# Patient Record
Sex: Male | Born: 1975 | Race: Black or African American | Hispanic: No | Marital: Married | Smoking: Current every day smoker
Health system: Southern US, Community
[De-identification: ages and names within clinical notes are randomized; demographics above are authoritative.]

---

## 1998-12-03 ENCOUNTER — Encounter: Payer: Self-pay | Admitting: Emergency Medicine

## 1998-12-03 ENCOUNTER — Emergency Department (HOSPITAL_COMMUNITY): Admission: EM | Admit: 1998-12-03 | Discharge: 1998-12-03 | Payer: Self-pay | Admitting: Emergency Medicine

## 1998-12-07 ENCOUNTER — Emergency Department (HOSPITAL_COMMUNITY): Admission: EM | Admit: 1998-12-07 | Discharge: 1998-12-07 | Payer: Self-pay | Admitting: Emergency Medicine

## 1999-07-02 ENCOUNTER — Encounter: Payer: Self-pay | Admitting: Emergency Medicine

## 1999-07-02 ENCOUNTER — Emergency Department (HOSPITAL_COMMUNITY): Admission: EM | Admit: 1999-07-02 | Discharge: 1999-07-02 | Payer: Self-pay | Admitting: Emergency Medicine

## 1999-10-25 ENCOUNTER — Emergency Department (HOSPITAL_COMMUNITY): Admission: EM | Admit: 1999-10-25 | Discharge: 1999-10-25 | Payer: Self-pay | Admitting: Emergency Medicine

## 1999-11-11 ENCOUNTER — Emergency Department (HOSPITAL_COMMUNITY): Admission: EM | Admit: 1999-11-11 | Discharge: 1999-11-11 | Payer: Self-pay | Admitting: Emergency Medicine

## 2000-08-16 ENCOUNTER — Emergency Department (HOSPITAL_COMMUNITY): Admission: EM | Admit: 2000-08-16 | Discharge: 2000-08-16 | Payer: Self-pay | Admitting: Internal Medicine

## 2000-11-09 ENCOUNTER — Encounter: Payer: Self-pay | Admitting: Emergency Medicine

## 2000-11-09 ENCOUNTER — Emergency Department (HOSPITAL_COMMUNITY): Admission: EM | Admit: 2000-11-09 | Discharge: 2000-11-09 | Payer: Self-pay | Admitting: Emergency Medicine

## 2001-01-26 ENCOUNTER — Encounter: Payer: Self-pay | Admitting: Emergency Medicine

## 2001-01-26 ENCOUNTER — Emergency Department (HOSPITAL_COMMUNITY): Admission: EM | Admit: 2001-01-26 | Discharge: 2001-01-26 | Payer: Self-pay | Admitting: *Deleted

## 2001-02-01 ENCOUNTER — Ambulatory Visit (HOSPITAL_COMMUNITY): Admission: RE | Admit: 2001-02-01 | Discharge: 2001-02-01 | Payer: Self-pay | Admitting: Orthopedic Surgery

## 2001-02-01 ENCOUNTER — Encounter: Payer: Self-pay | Admitting: Orthopedic Surgery

## 2001-02-10 ENCOUNTER — Emergency Department (HOSPITAL_COMMUNITY): Admission: EM | Admit: 2001-02-10 | Discharge: 2001-02-10 | Payer: Self-pay | Admitting: Emergency Medicine

## 2001-02-10 ENCOUNTER — Encounter: Payer: Self-pay | Admitting: Emergency Medicine

## 2001-02-14 ENCOUNTER — Emergency Department (HOSPITAL_COMMUNITY): Admission: EM | Admit: 2001-02-14 | Discharge: 2001-02-14 | Payer: Self-pay

## 2003-04-12 ENCOUNTER — Emergency Department (HOSPITAL_COMMUNITY): Admission: AD | Admit: 2003-04-12 | Discharge: 2003-04-12 | Payer: Self-pay | Admitting: Family Medicine

## 2003-04-19 ENCOUNTER — Emergency Department (HOSPITAL_COMMUNITY): Admission: AD | Admit: 2003-04-19 | Discharge: 2003-04-19 | Payer: Self-pay | Admitting: Family Medicine

## 2003-05-20 ENCOUNTER — Emergency Department (HOSPITAL_COMMUNITY): Admission: EM | Admit: 2003-05-20 | Discharge: 2003-05-20 | Payer: Self-pay | Admitting: Family Medicine

## 2003-05-23 ENCOUNTER — Emergency Department (HOSPITAL_COMMUNITY): Admission: EM | Admit: 2003-05-23 | Discharge: 2003-05-23 | Payer: Self-pay | Admitting: Emergency Medicine

## 2004-03-09 ENCOUNTER — Observation Stay (HOSPITAL_COMMUNITY): Admission: EM | Admit: 2004-03-09 | Discharge: 2004-03-09 | Payer: Self-pay | Admitting: *Deleted

## 2004-03-09 ENCOUNTER — Encounter: Payer: Self-pay | Admitting: Cardiology

## 2005-01-22 ENCOUNTER — Emergency Department (HOSPITAL_COMMUNITY): Admission: EM | Admit: 2005-01-22 | Discharge: 2005-01-22 | Payer: Self-pay | Admitting: Emergency Medicine

## 2005-03-08 ENCOUNTER — Emergency Department (HOSPITAL_COMMUNITY): Admission: EM | Admit: 2005-03-08 | Discharge: 2005-03-08 | Payer: Self-pay | Admitting: Emergency Medicine

## 2005-05-17 ENCOUNTER — Emergency Department (HOSPITAL_COMMUNITY): Admission: EM | Admit: 2005-05-17 | Discharge: 2005-05-17 | Payer: Self-pay | Admitting: Emergency Medicine

## 2005-07-30 ENCOUNTER — Emergency Department (HOSPITAL_COMMUNITY): Admission: EM | Admit: 2005-07-30 | Discharge: 2005-07-30 | Payer: Self-pay | Admitting: Emergency Medicine

## 2007-12-31 ENCOUNTER — Emergency Department (HOSPITAL_COMMUNITY): Admission: EM | Admit: 2007-12-31 | Discharge: 2007-12-31 | Payer: Self-pay | Admitting: Emergency Medicine

## 2008-10-09 ENCOUNTER — Emergency Department (HOSPITAL_COMMUNITY): Admission: EM | Admit: 2008-10-09 | Discharge: 2008-10-09 | Payer: Self-pay | Admitting: Emergency Medicine

## 2008-11-21 ENCOUNTER — Emergency Department (HOSPITAL_COMMUNITY): Admission: EM | Admit: 2008-11-21 | Discharge: 2008-11-21 | Payer: Self-pay | Admitting: Emergency Medicine

## 2010-04-30 LAB — HEPATITIS C ANTIBODY, REFLEX: HCV Ab: NEGATIVE

## 2010-04-30 LAB — HEPATITIS B SURFACE ANTIGEN: Hepatitis B Surface Ag: NEGATIVE

## 2010-06-11 NOTE — Consult Note (Signed)
NAMEMADDUX, VANSCYOC NO.:  [id number]   MEDICAL RECORD NO.:  [id number]          PATIENT TYPE:  INP   LOCATION:  0103                         FACILITY:  [facility]   PHYSICIAN:  Colleen Can. Deborah Chalk, M.D.DATE OF BIRTH:  Aug 31, 1975   DATE OF CONSULTATION:  03/09/2004  DATE OF DISCHARGE:                                   CONSULTATION   Thank you very much for asking [place] to see Mr. Cormier.  He is a very pleasant,  35 year old male, who presents with chest pain which started about 11 p.m.  last night.  He was at work, has been working the evening shift and over the  last 2-3 weeks, has been drinking excessive amounts of coffee to stay awake.  He has been under a lot of stress with his wife having cervical cancer, and  he has five small children.  He lives with his mother, and his wife lives  with her mother, and he is trying to help out with both family situations.  The chest pain was somewhat sharp, associated with shortness of breath,  palpitations, and his heart racing.  The pain perhaps was more on the right  side of his chest.  He had more of his palpitations, and he got frightened  and sought medical evaluation.   PAST MEDICAL HISTORY:  A fractured right hand.   ALLERGIES:  None.   MEDICATIONS:  None.   FAMILY HISTORY:  Both parents are alive at 58, with mother with  hypertension.   SOCIAL HISTORY:  He is married.  He is employed in temporary jobs.  He  smokes 2-3 cigarettes a week, smokes marijuana 2-3 times a week, no alcohol,  denies cocaine.  He has five children in total.   REVIEW OF SYSTEMS:  He has had palpitations over the last 2 months.  He has  had increasing caffeine over the past several weeks.   PHYSICAL EXAMINATION:  GENERAL:  He is a well-developed black male in no  acute distress.  He is normal body weight and muscular.  VITAL SIGNS:  Blood pressure 142/80, heart rate 60, respiratory rate 20.  There is a sinus arrhythmia present.  SKIN:  Warm  and dry.  Color is normal.  LUNGS:  Clear.  HEART:  Regular rate and rhythm without click, murmur, or gallop.  ABDOMEN:  Soft, nontender.  EXTREMITIES:  Without edema.  NEUROLOGIC:  He is intact.   PERTINENT LABORATORY DATA:  TSH is normal.  CK and troponin are all  negative.  Chemistries are negative. __________ 43.   Urine is positive for marijuana.   Chest x-ray is normal.   EKG shows ST elevation diffusely, compatible with early repolarization.   VQ scan is negative.   2 D echocardiogram is normal.  Pericardium is normal.  Valvular function is  normal.  Left ventricular function is normal.   OVERALL IMPRESSION:  1.  Palpitations and chest pain, possibly related to stress and/or excessive      caffeine.  2.  Structurally normal heart by echocardiography.  3.  Early repolarization.  4.  Chest  pain, atypical for angina.   PLAN:  I suspect that the palpitations are related to stress, lack of sleep,  as well as caffeine.  His 2 D echocardiogram was normal and did not show  pericarditis.  I think it is okay to discharge him from the hospital.  I  cautioned him about excessives.  I have encouraged him to get some sleep and  try to avoid caffeine.  Unfortunately, his social situation is difficult but  certainly will be made better by an appropriate amount of rest.      SNT/MEDQ  D:  03/09/2004  T:  03/09/2004  Job:  [id number]   cc:   Lorelle Formosa, M.D.  [phone] E. [address]  [place]  [place] [address]  Fax: [phone]   Elliot Cousin, M.D.

## 2010-06-11 NOTE — H&P (Signed)
NAME:  Drew Crosby, Drew Crosby NO.:  [id number]   MEDICAL RECORD NO.:  [id number]          PATIENT TYPE:  EMS   LOCATION:  ED                           FACILITY:  [facility]   PHYSICIAN:  Elliot Cousin, M.D.    DATE OF BIRTH:  1975-09-16   DATE OF ADMISSION:  03/09/2004  DATE OF DISCHARGE:                                HISTORY & PHYSICAL   PRIMARY CARE PHYSICIAN:  Lorelle Formosa, M.D.   CHIEF COMPLAINT:  Chest pain and shortness of breath.   HISTORY OF PRESENT ILLNESS:  Drew Crosby is a 35 year old man with no  significant past medical history who presents to the emergency department  this morning with a chief complaint of chest pain and shortness of breath  while he was at work. At approximately 11:00 p.m. last night, while at work,  he began to have right sided chest pain and shortness of breath.  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  The pain was  describes as a pressing down feeling. There was only a little radiation to  the central chest wall.  The pain intensity was estimated at a 9-10/10.  The  pain has been intermittent all night and early into this morning. At times,  his heart feels as if it is racing. He has had accompanied palpitations. No  diaphoresis; however, he has had some transient nausea and transient light  headedness. The patient has had a mild cough primarily dry.  He has some  chest pain primarily on the right and to the central chest when coughing. He  denies specific pleurisy.  The patient has had no recent upper respiratory  infection symptoms with the exception of an intermittent dry cough.  He has  had chest pain in the past which he attributes to heavy lifting. The patient  has had no recent heavy lifting.  The patient has been under more stress  lately, his wife has been diagnosed with cervical cancer.  He smokes only 2-  3 cigarettes per week.  He denies crack cocaine use; however, he does smoke  marijuana at least  2-3 times per week.  The patient does not have a family  history of heart disease or early stroke or blood clots.   When the patient was evaluated in the emergency department, he was afebrile  and hemodynamically stable. His heart rate was borderline bradycardic  ranging between 58 and 62.  His EKG revealed ST elevations in the  anterolateral and inferior leads, possibly secondary to early  repolarization.  There was also evidence of bradycardia with a heart rate of  47 beats per minute. His chest x-ray revealed no acute disease.  His initial  cardiac markers were negative.  However, given the patient's presentation he  will be admitted for 23 hour observation.   PAST MEDICAL HISTORY:  History of right hand fracture in the past.   MEDICATIONS:  None.   ALLERGIES:  No known drug allergies.   FAMILY HISTORY:  His father is 34 years of age and has no significant past  medical  history. Specifically no history of myocardial infarction, blood  clots or cancer. His mother is 75 years of age and she is living. She has a  history of anemia and hypertension. Specifically no history of blood clots,  myocardial infarction or cancer.   SOCIAL HISTORY:  The patient is married and lives [place] Between he and his wife, there are five  children. His wife was recently diagnosed with cervical cancer.  He is  employed at [employer].  He smokes 2-3 cigarettes per week. He smokes marijuana 2-  3 times per week. He denies cocaine use and he denies any other illicit drug  use other than marijuana. He denies alcohol use.   REVIEW OF SYMPTOMS:  The patient's review of systems is positive for  increased stress and question anxiety regarding his wife's diagnosis of  cancer. He has had some chest pain in the distant past which was accompanied  by shortness of breath. The patient states that he feels the chest pain was  secondary to a muscle pull. Otherwise his review of  systems is negative.   PHYSICAL EXAMINATION:  VITAL SIGNS:  Temperature 97.4, blood pressure  142/80, pulse 60, respiratory rate 20, oxygen saturation 99% on room air.  GENERAL:  The patient is an average size 35 year old African-American man  who is currently lying in bed in no acute distress.  HEENT:  Head is normocephalic, atraumatic. Pupils equal round and reactive  to light. Extraocular movements intact. Conjunctivae are clear, sclerae are  white. Tympanic membranes are clear bilaterally. The mucosal is moist, no  drainage, no sinus tenderness.  Oropharynx reveals mildly dry mucous  membranes. No posterior exudates or erythema.  His teeth are in fair repair.  NECK:  Supple, no adenopathy, no thyromegaly, no bruit, no JVD.  LUNGS:  Clear to auscultation bilaterally. Breathing is nonlabored.  CHEST WALL:  The patient has mild tenderness over his substernal area as  well as the right chest wall.  No rash or erythema or edema seen or  palpated.  HEART:  S1, S2 with bradycardia.  ABDOMEN:  Positive bowel sounds, soft, nontender, nondistended, no  hepatosplenomegaly. No masses palpate.  GU/RECTAL:  Deferred.  EXTREMITIES:  Pedal pulses 2+ bilaterally. No pretibial edema, no pedal  edema. The patient has a good range of motion of all of his joints. No acute  joint abnormalities.  NEUROLOGIC/PSYCHOLOGIC:  The patient is alert and oriented x3.  His affect  is flat. He does wonder if he has had too much stress in his lief that has  brought on the chest pain. Cranial nerves II-XII are intact. Strength is 5/5  throughout, sensation is intact. Gait is within normal limits.   ADMISSION LABORATORY DATA:  Chest x-ray no acute disease.  EKG, bradycardia  with PVC's and ventricular rate of 47 beats per minute. Question ST  elevations in the anterolateral and inferior leads.  Myoglobin 80, CK-MB  1.4, troponin I less than 0.05.  Urinalysis negative. Urine drug screen positive only for THC.  Sodium  143, potassium 3.6, chloride 107, CO2 30,  glucose 81, BUN 13, creatinine 1.3, calcium 9.0.  WBC 3.8, hemoglobin 14.4,  hematocrit 43.4, platelets 178.   ASSESSMENT:  Chest pain accompanied by dyspnea.  The patient also has an  abnormal EKG. The patient's clinical presentation is worrisome for cardiac  disease.  The patient, however, does not have a strong family history of  heart disease.  He does have one  risk factor including tobacco use and  marijuana use.  There is also a concern regarding PE.  The patient does not  appear to have risk factors for PE as well. The patient's symptoms could  very well be secondary to anxiety and stress.  Also consider  gastroesophageal reflux disease, gastritis, etc.   PLAN:  1.  The patient will be admitted for 23 hour observation.  2.  Repeat EKG.  3.  Consult cardiology, Dr. Deborah Chalk has been consulted.  4.  Further assessment with 2-D echocardiogram and V/Q scan.  5.  Collect cardiac enzymes q.8 hours x3.  6.  Gentle IV fluids.  Will add an aspirin at 81 mg daily.  7.  Sublingual nitroglycerin as needed for chest pain as well as oxycodone      and Tylenol as needed.  8.  Empiric treatment with Pepcid at 20 mg daily.  Maalox p.r.n.  9.  Will start Xanax at 0.25 mg b.i.d.      DF/MEDQ  D:  03/09/2004  T:  03/09/2004  Job:  [id number]   cc:   Lorelle Formosa, M.D.  [phone] E. [address]  [place]  [place] [address]  Fax: [phone]

## 2010-06-11 NOTE — Op Note (Signed)
[facility]  Patient:    HARRIE, CAZAREZ Visit Number: [id number] MRN: [id number]          Service Type: DSU Location: DAY Attending Physician:  Dominica Severin Dictated by:   Elisha Ponder, M.D. Proc. Date: 02/01/01 Admit Date:  02/01/2001   CC:         Elisha Ponder, M.D.   Operative Report  DATE OF BIRTH:  03-Apr-1975.  PREOPERATIVE DIAGNOSIS:  Displaced right third metacarpal fracture, with a prior fracture that healed in dorsal fixed angulation, noted.  POSTOPERATIVE DIAGNOSIS:  Displaced right third metacarpal fracture, with a prior fracture that healed in dorsal fixed angulation, noted.  PROCEDURE: 1. Open reduction internal fixation, right third metacarpal fracture. 2. Stress radiography.  SURGEON:  Dominica Severin, M.D.  ASSISTANT:  Dorie Rank, P.A.  COMPLICATIONS:  None.  ANESTHESIA:  General.  TOURNIQUET TIME:  Less than an hour.  ESTIMATED BLOOD LOSS:  Minimal.  DRAINS:  None.  INDICATIONS FOR PROCEDURE:  This patient is a 35 year old black male who presents with the above-mentioned diagnosis. The patient was counseled in regards to the risk and benefits of surgery, including risks of infection, bleeding, anesthesia, damage to normal structures, and failure of surgery to accomplish its intended goals of relieving symptoms and restoring function. With this in mind, he desires to proceed. All questions have been encouraged and answered preoperatively. This patient does have a prior deformity about the metacarpal from a prior fracture with subsequent healing. He has fractured through the previous fracture that has healed. I have discussed with him all issues. All questions have been encouraged and answered.  DESCRIPTION OF PROCEDURE:  The patient was seen by myself and anesthesia. He was taken to the operative suite. He underwent a smooth induction of general endotracheal anesthesia, given preoperative Ancef, and laid  supine appropriately padded and prepped and draped in the usual sterile fashion. Anesthesia was done under the direction of Dr. Lyda Perone Tarasidis. Once this was done, the patient then underwent placement of a sterile towel and drape. Once _______ was isolated, the upper extremity was elevated, the tourniquet was insufflated to 250 mmHg and an incision was made transversely at the base of the third metacarpal. Dissection was carried down to the third metacarpal base and curved awl was used to enter and provide a pilot hole for an intermedullary guidewire. It was quite evident that the prior fracture deformity was going to cause problems with passing the wire intermedullary; thus, I opened the fracture site and with an awl, enlarged the medullary canal. This was a reamed technique. I reamed the fracture site proximally and distally taking care to preserve periosteal and soft tissue attachments. The patient tolerated this quite nicely. Following this, the patient had intermedullary wire, 0.062 girth approximately placed antegrade into the proximal portion of the metacarpal. This engaged the fracture site nicely, actually corrected some of the previous deformity. With this performed, the patient then had reamings placed around the fracture site. The pin was bent and seated so it would be away from the _______ compartment and the periosteal tissues were then closed over the pin so that it would not provide any friction or attritional wear against the tendons. Tourniquet was deflated, copious irrigation was applied, and the wound was then closed with 4-0 Prolene. The patient had soft compartments, normal refill, and there were no complications. He was placed in a sterile dressing and a volar pressure splint with hand in functional position. He was transferred to  the recovery room in stable condition. All sponge, needle, and instrument counts were reported as correct. He will be discharged home on  [facility], Robaxin, and Keflex x 24 hours. He will given an additional gram of Ancef in the recovery room; elevation and return to the office to see me in ten days. All questions have been encouraged and answered. Dictated by:   Elisha Ponder, M.D. Attending Physician:  Dominica Severin DD:  02/01/01 TD:  02/01/01 Job: [id number] CNO/[id number]

## 2010-06-11 NOTE — Discharge Summary (Signed)
NAMEVERNOR, MONNIG NO.:  [id number]   MEDICAL RECORD NO.:  [id number]          PATIENT TYPE:  INP   LOCATION:  0363                         FACILITY:  [facility]   PHYSICIAN:  Isidor Holts, M.D.  DATE OF BIRTH:  Oct 03, 1975   DATE OF ADMISSION:  03/09/2004  DATE OF DISCHARGE:  03/09/2004                                 DISCHARGE SUMMARY   PRIMARY CARE PHYSICIAN:  Lorelle Formosa, M.D.   DISCHARGE DIAGNOSES:  1.  Atypical chest pain/right-sided musculoskeletal chest pain.  2.  Anxiety.   DISCHARGE MEDICATIONS:  Motrin 800 mg p.o. t.i.d. with food for five days  only.   PROCEDURES:  1.  Chest x-ray dated March 09, 2004.  This showed no acute      cardiopulmonary findings.  2.  Ventilation perfusion lung scan, dated March 09, 2004.  This showed      normal ventilation perfusion pulmonary imaging.  3.  2D echocardiogram March 09, 2004. This showed overall normal left      ventricular function, with LVEF 55%-65%, and no valvular defects or      regional wall motion abnormalities   CONSULTATIONS:  Dr. Deborah Chalk, cardiology.   HISTORY OF PRESENT ILLNESS:  As in H&P notes of March 09, 2004.  However,  in brief, this is a 35 year old man with no significant past medical history  who presents with an overnight history of right-sided chest pain and  shortness of breath without any obvious precipitant.  He did have occasional  palpitations but no diaphoresis.  He denies respiratory tract symptoms,  smokes marijuana about 2-3 times a week, and smokes about 2-3 cigarettes per  week.  He was admitted for investigation, evaluation, and management.   CLINICAL COURSE:  1.  Atypical/Right-sided chest pain without obvious precipitant.  On      physical examination the patient revealed no clinical features of heart      failure.  He was hemodynamically stable.  A 12 lead EKG showed ST      elevations in anterolateral and inferior leads consistent with early     repolarization.  Chest x-ray was unremarkable.  He did have localized      chest wall soreness on the right lateral aspect.  Ventilation perfusion      lung scan dated March 09, 2004 revealed no evidence of pulmonary      embolism.  A 2-D echocardiogram dated March 09, 2004 showed normal      overall left ventricular systolic function with LV EF of 55-65% with no      regional wall motion abnormalities or valvular abnormalities.      Cardiology consultation was kindly provided by Dr. Deborah Chalk and      conclusion was that this was non-cardiac chest pain. The patient has      been commenced on Motrin 800 mg p.o. t.i.d. which he is to take for five      days.  It is likely that his chest pain is musculoskeletal in nature.   1.  Anxiety.  This is probably responsible for the patient's palpitations.  Certainly he showed no arrhythmias during the course of his hospital      stay.  Thyroid profile was within normal limits.  It is expected that      this will be followed up by his primary M.D.   1.  Substance abuse, i.e., marijuana use.  The patient has been cautioned to      refrain.   DISPOSITION:  The patient was discharged in satisfactory condition on  March 09, 2004.   PAIN MANAGEMENT:  As in discharge medications.   ACTIVITY:  As tolerated.   DIET:  No restrictions.   WOUND CARE:  Not applicable.   FOLLOW UP:  The patient is to follow up with his primary M.D., Dr. Billee Cashing, within one week.  He is to call to schedule an appointment and has  verbalized understanding.      CO/MEDQ  D:  03/09/2004  T:  03/09/2004  Job:  [id number]   cc:   Lorelle Formosa, M.D.  [phone] [address]  [place]  [place] [address]  Fax: [phone]   Colleen Can. Deborah Chalk, M.D.  Fax: [phone]

## 2011-03-14 ENCOUNTER — Emergency Department (INDEPENDENT_AMBULATORY_CARE_PROVIDER_SITE_OTHER)
Admission: EM | Admit: 2011-03-14 | Discharge: 2011-03-14 | Disposition: A | Discharge status: Home Health | Payer: Self-pay | Source: Home / Self Care | Attending: Family Medicine | Admitting: Family Medicine

## 2011-03-14 ENCOUNTER — Encounter (HOSPITAL_COMMUNITY): Payer: Self-pay

## 2011-03-14 DIAGNOSIS — K0889 Other specified disorders of teeth and supporting structures: Secondary | ICD-10-CM

## 2011-03-14 DIAGNOSIS — K089 Disorder of teeth and supporting structures, unspecified: Secondary | ICD-10-CM

## 2011-03-14 DIAGNOSIS — K007 Teething syndrome: Secondary | ICD-10-CM

## 2011-03-14 MED ORDER — AMOXICILLIN 500 MG PO CAPS: 500.0000 mg | ORAL_CAPSULE | Freq: Three times a day (TID) | ORAL | Status: AC

## 2011-03-14 MED ORDER — HYDROCODONE-ACETAMINOPHEN 5-325 MG PO TABS: ORAL_TABLET | ORAL | Status: AC

## 2011-03-14 MED ORDER — LIDOCAINE VISCOUS 2 % MT SOLN: 20.0000 mL | OROMUCOSAL | Status: AC | PRN

## 2011-03-14 NOTE — ED Provider Notes (Signed)
History     CSN: [id number]  Arrival date & time 03/14/11  1946   First MD Initiated Contact with Patient 03/14/11 1950      Chief Complaint  Patient presents with  . Oral Swelling    (Consider location/radiation/quality/duration/timing/severity/associated sxs/prior treatment) HPI Comments: Drew Crosby presents for evaluation of left lower gum pain. He denies any injury and is concerned if he has an infection or not. He reports a chronic history of dental problems and has had several teeth removed. He denies any injury to his teeth at this time. He does note a height difference between the posterior most molar and the rest of his teeth. He denies any fever or other symptoms at all. He reports onset of symptoms over the last 3 weeks. He's been using multiple regimens without any relief.  Patient is a 36 y.o. male presenting with tooth pain. The history is provided by the patient.  Dental PainThe primary symptoms include mouth pain. Primary symptoms do not include dental injury or oral bleeding. The symptoms began more than 1 week ago. The symptoms are unchanged.  Additional symptoms include: gum swelling, gum tenderness and jaw pain. Additional symptoms do not include: dental sensitivity to temperature, purulent gums and trouble swallowing.    History reviewed. No pertinent past medical history.  History reviewed. No pertinent past surgical history.  History reviewed. No pertinent family history.  History  Substance Use Topics  . Smoking status: Not on file  . Smokeless tobacco: Not on file  . Alcohol Use: Not on file      Review of Systems  Constitutional: Negative.   HENT: Positive for dental problem. Negative for trouble swallowing.   Eyes: Negative.   Respiratory: Negative.   Cardiovascular: Negative.   Gastrointestinal: Negative.   Genitourinary: Negative.   Musculoskeletal: Negative.   Skin: Negative.   Neurological: Negative.     Allergies  Review of patient's  allergies indicates no known allergies.  Home Medications   Current Outpatient Rx  Name Route Sig Dispense Refill  . AMOXICILLIN 500 MG PO CAPS Oral Take 1 capsule (500 mg total) by mouth 3 (three) times daily. 30 capsule 0  . HYDROCODONE-ACETAMINOPHEN 5-325 MG PO TABS  Take one to two tablets every 4 to 6 hours as needed for pain 10 tablet 0  . LIDOCAINE VISCOUS 2 % MT SOLN Oral Take 20 mLs by mouth as needed for pain. Gargle with 15 to 20 ml every 3 to 4 hours as needed for pain; do not swallow 100 mL 0    BP 120/67  Pulse 59  Temp(Src) 97.9 F (36.6 C) (Oral)  Resp 20  SpO2 100%  Physical Exam  Nursing note and vitals reviewed. Constitutional: He is oriented to person, place, and time. He appears well-developed and well-nourished.  HENT:  Head: Normocephalic and atraumatic.  Mouth/Throat: Uvula is midline, oropharynx is clear and moist and mucous membranes are normal. Normal dentition. Dental caries present. No dental abscesses.    Eyes: EOM are normal.  Neck: Normal range of motion.  Pulmonary/Chest: Effort normal.  Musculoskeletal: Normal range of motion.  Neurological: He is alert and oriented to person, place, and time.  Skin: Skin is warm and dry.  Psychiatric: His behavior is normal.    ED Course  Procedures (including critical care time)  Labs Reviewed - No data to display No results found.   1. Pain, dental   2. Tooth eruption       MDM  rx given for  viscous lidocaine, hydrocodone PRN, contact info for [facility]        Richardo Priest, MD 03/14/11 2149

## 2011-03-14 NOTE — Discharge Instructions (Signed)
There is no evidence of infection in your gum or teeth. It appears as if a molar is erupting. This could be a wisdom tooth, but I am not sure. The pain is from the gum inflammation. Use the viscous gel as directed. You should gargle and spit this solution and do not swallow. Use the pain pills only as needed. Do not fill the antibiotic prescription yet. Once you contact the dental provider, ask if you should take the antibiotics. Please followup with the dental provider provided.

## 2011-03-14 NOTE — ED Notes (Signed)
Reports he has been having problems w tooth swelling and painful; per patient, he has been using tylenol, nyquil, advil, ibuprofin, oragel, salt water rinses w/o relief

## 2011-06-13 ENCOUNTER — Encounter (HOSPITAL_COMMUNITY): Payer: Self-pay | Admitting: *Deleted

## 2011-06-13 ENCOUNTER — Emergency Department (HOSPITAL_COMMUNITY)
Admission: EM | Admit: 2011-06-13 | Discharge: 2011-06-13 | Disposition: A | Discharge status: Home / Self Care | Payer: Self-pay | Attending: Emergency Medicine | Admitting: Emergency Medicine

## 2011-06-13 DIAGNOSIS — L0390 Cellulitis, unspecified: Secondary | ICD-10-CM

## 2011-06-13 DIAGNOSIS — L02419 Cutaneous abscess of limb, unspecified: Secondary | ICD-10-CM | POA: Insufficient documentation

## 2011-06-13 DIAGNOSIS — L0291 Cutaneous abscess, unspecified: Secondary | ICD-10-CM

## 2011-06-13 MED ORDER — SULFAMETHOXAZOLE-TMP DS 800-160 MG PO TABS
1.0000 | ORAL_TABLET | Freq: Once | ORAL | Status: AC
  Administered 2011-06-13: 1 via ORAL
  Filled 2011-06-13: qty 1

## 2011-06-13 MED ORDER — OXYCODONE-ACETAMINOPHEN 5-325 MG PO TABS
1.0000 | ORAL_TABLET | Freq: Once | ORAL | Status: AC
  Administered 2011-06-13: 1 via ORAL
  Filled 2011-06-13: qty 1

## 2011-06-13 MED ORDER — OXYCODONE-ACETAMINOPHEN 5-325 MG PO TABS: 1.0000 | ORAL_TABLET | ORAL | Status: AC | PRN

## 2011-06-13 MED ORDER — SULFAMETHOXAZOLE-TRIMETHOPRIM 800-160 MG PO TABS: 1.0000 | ORAL_TABLET | Freq: Two times a day (BID) | ORAL | Status: AC

## 2011-06-13 NOTE — ED Provider Notes (Signed)
Medical screening examination/treatment/procedure(s) were performed by non-physician practitioner and as supervising physician I was immediately available for consultation/collaboration.   Anijah Spohr, MD 06/13/11 1617 

## 2011-06-13 NOTE — ED Provider Notes (Signed)
History     CSN: [id number]  Arrival date & time 06/13/11  1131   First MD Initiated Contact with Patient 06/13/11 1238      Chief Complaint  Patient presents with  . Abscess    (Consider location/radiation/quality/duration/timing/severity/associated sxs/prior treatment) Patient is a 36 y.o. male presenting with abscess. The history is provided by the patient.  Abscess  This is a new problem. The current episode started less than one week ago. The problem occurs continuously. The problem has been gradually worsening. The abscess is present on the left ankle. The abscess is characterized by painfulness, draining, swelling and redness. Pertinent negatives include no fever. Associated symptoms comments: He thought initially he had been bitten by an insect but presents for evaluation after redness and swelling became worse, also having mild drainage from central area..    History reviewed. No pertinent past medical history.  History reviewed. No pertinent past surgical history.  History reviewed. No pertinent family history.  History  Substance Use Topics  . Smoking status: Current Everyday Smoker    Types: Cigarettes  . Smokeless tobacco: Not on file  . Alcohol Use: No      Review of Systems  Constitutional: Negative for fever.  Musculoskeletal: Positive for joint swelling.       See HPI.  Skin: Positive for wound.    Allergies  Review of patient's allergies indicates no known allergies.  Home Medications   Current Outpatient Rx  Name Route Sig Dispense Refill  . IBUPROFEN 800 MG PO TABS Oral Take 800 mg by mouth every 8 (eight) hours as needed. For pain      BP 117/73  Pulse 69  Temp 98.3 F (36.8 C)  Resp 16  SpO2 99%  Physical Exam  Constitutional: He appears well-developed and well-nourished.  Musculoskeletal: Normal range of motion.  Skin:       Left LE has an indurated area to posterolateral surface with small ulceration in the center that is not  currently draining. Swelling extends without induration to ankle. Redness from distal LE to lateral aspect of foot. Pulses 2+. ROM of ankle joint without pain. No calf tenderness.    ED Course  Procedures (including critical care time) INCISION AND DRAINAGE Performed by: Langley Adie A Consent: Verbal consent obtained. Risks and benefits: risks, benefits and alternatives were discussed Type: abscess  Body area: left lower leg  Anesthesia: local infiltration  Local anesthetic: lidocaine 1% w/o epinephrine  Anesthetic total: 1 ml  Complexity: complex Blunt dissection to break up loculations  Drainage: purulent  Drainage amount: small  Packing material: 1/4 in iodoform gauze  Patient tolerance: Patient tolerated the procedure well with no immediate complications.    Labs Reviewed - No data to display No results found.   No diagnosis found. 1. Cutaneous abscess 2. cellulitis  MDM   Patient feels improved after I&D. Pain controlled. Abx started, instructions given for 2 day recheck.        Rodena Medin, PA-C 06/13/11 1433

## 2011-06-13 NOTE — Discharge Instructions (Signed)
RETURN IN 2 DAYS FOR RECHECK OF ABSCESS AND TO HAVE PACKING REMOVED. TAKE MEDICATIONS AS PRESCRIBED. RETURN SOONER THAN 2 DAYS IF YOU HAVE A HIGH FEVER, SEVERE PAIN, OR IF THE REDNESS EXTENDS SIGNIFICANTLY BEYOND THE MARKED AREA IN THE LOWER LEG.   Cellulitis Cellulitis is an infection of the tissue under the skin. The infected area is usually red and tender. This is caused by germs. These germs enter the body through cuts or sores. This usually happens in the arms or lower legs. HOME CARE   Take your medicine as told. Finish it even if you start to feel better.   If the infection is on the arm or leg, keep it raised (elevated).   Use a warm cloth on the infected area several times a day.   See your doctor for a follow-up visit as told.  GET HELP RIGHT AWAY IF:   You are tired or confused.   You throw up (vomit).   You have watery poop (diarrhea).   You feel ill and have muscle aches.   You have a fever.  MAKE SURE YOU:   Understand these instructions.   Will watch your condition.   Will get help right away if you are not doing well or get worse.  Document Released: 06/29/2007 Document Revised: 12/30/2010 Document Reviewed: 12/12/2008 [facility] Patient Information 2012 [place], [place].  Abscess An abscess (boil or furuncle) is an infected area that contains a collection of pus.  SYMPTOMS Signs and symptoms of an abscess include pain, tenderness, redness, or hardness. You may feel a moveable soft area under your skin. An abscess can occur anywhere in the body.  TREATMENT  A surgical cut (incision) may be made over your abscess to drain the pus. Gauze may be packed into the space or a drain may be looped through the abscess cavity (pocket). This provides a drain that will allow the cavity to heal from the inside outwards. The abscess may be painful for a few days, but should feel much better if it was drained.  Your abscess, if seen early, may not have localized and may not have  been drained. If not, another appointment may be required if it does not get better on its own or with medications. HOME CARE INSTRUCTIONS   Only take over-the-counter or prescription medicines for pain, discomfort, or fever as directed by your caregiver.   Take your antibiotics as directed if they were prescribed. Finish them even if you start to feel better.   Keep the skin and clothes clean around your abscess.   If the abscess was drained, you will need to use gauze dressing to collect any draining pus. Dressings will typically need to be changed 3 or more times a day.   The infection may spread by skin contact with others. Avoid skin contact as much as possible.   Practice good hygiene. This includes regular hand washing, cover any draining skin lesions, and do not share personal care items.   If you participate in sports, do not share athletic equipment, towels, whirlpools, or personal care items. Shower after every practice or tournament.   If a draining area cannot be adequately covered:   Do not participate in sports.   Children should not participate in day care until the wound has healed or drainage stops.   If your caregiver has given you a follow-up appointment, it is very important to keep that appointment. Not keeping the appointment could result in a much worse infection, chronic  or permanent injury, pain, and disability. If there is any problem keeping the appointment, you must call back to this facility for assistance.  SEEK MEDICAL CARE IF:   You develop increased pain, swelling, redness, drainage, or bleeding in the wound site.   You develop signs of generalized infection including muscle aches, chills, fever, or a general ill feeling.   You have an oral temperature above 102 F (38.9 C).  MAKE SURE YOU:   Understand these instructions.   Will watch your condition.   Will get help right away if you are not doing well or get worse.  Document Released:  10/20/2004 Document Revised: 12/30/2010 Document Reviewed: 08/14/2007 [facility] Patient Information 2012 [place], [place].

## 2011-06-13 NOTE — ED Notes (Signed)
To ED for eval of possible insect bite to left ankle area. Pt states he noticed a white head on area last night. Now left ankle down to foot is swollen and painful.

## 2011-06-16 LAB — CULTURE, ROUTINE-ABSCESS

## 2011-06-17 NOTE — ED Notes (Signed)
+  Abscess. Patient treated with Bactrim DS. Sensitive to same. Per protocol MD.

## 2013-12-08 ENCOUNTER — Encounter (HOSPITAL_COMMUNITY): Payer: Self-pay | Admitting: Emergency Medicine

## 2013-12-08 ENCOUNTER — Emergency Department (HOSPITAL_COMMUNITY)
Admission: EM | Admit: 2013-12-08 | Discharge: 2013-12-08 | Disposition: A | Discharge status: Home / Self Care | Payer: Self-pay | Attending: Emergency Medicine | Admitting: Emergency Medicine

## 2013-12-08 DIAGNOSIS — Z720 Tobacco use: Secondary | ICD-10-CM | POA: Insufficient documentation

## 2013-12-08 DIAGNOSIS — K047 Periapical abscess without sinus: Secondary | ICD-10-CM | POA: Insufficient documentation

## 2013-12-08 DIAGNOSIS — K029 Dental caries, unspecified: Secondary | ICD-10-CM | POA: Insufficient documentation

## 2013-12-08 MED ORDER — AMOXICILLIN 500 MG PO CAPS: 500.0000 mg | ORAL_CAPSULE | Freq: Three times a day (TID) | ORAL | Status: AC

## 2013-12-08 MED ORDER — OXYCODONE-ACETAMINOPHEN 5-325 MG PO TABS: ORAL_TABLET | ORAL | Status: DC

## 2013-12-08 MED ORDER — AMOXICILLIN 500 MG PO CAPS
500.0000 mg | ORAL_CAPSULE | Freq: Once | ORAL | Status: AC
  Administered 2013-12-08: 500 mg via ORAL
  Filled 2013-12-08: qty 1

## 2013-12-08 MED ORDER — OXYCODONE-ACETAMINOPHEN 5-325 MG PO TABS
1.0000 | ORAL_TABLET | Freq: Once | ORAL | Status: AC
  Administered 2013-12-08: 1 via ORAL
  Filled 2013-12-08: qty 1

## 2013-12-08 NOTE — ED Notes (Signed)
Pt presents with left lower dental pain onset yesterday- admits to taking Motrin at home without relief.

## 2013-12-08 NOTE — Discharge Instructions (Signed)
Take percocet for breakthrough pain, do not drink alcohol, drive, care for children or do other critical tasks while taking percocet.  Return to the emergency room for fever, change in vision, redness to the face that rapidly spreads towards the eye, nausea or vomiting, difficulty swallowing or shortness of breath.   Apply warm compresses to jaw throughout the day.    Take your antibiotics as directed and to the end of the course.   Followup with a dentist is very important for ongoing evaluation and management of recurrent dental pain. Return to emergency department for emergent changing or worsening symptoms."  Low-cost dental clinic: Drew Crosby**Drew Crosby  at [phone]**  **Drew Crosby at [phone] [address]**    You may also call [phone]  Dental Assistance If the dentist on-call cannot see you, please use the resources below:   Patients with Medicaid: [facility] [phone], [phone] [address], [phone]  If unable to pay, or uninsured, contact [facility] [phone]) or [facility] [phone] in [place], [phone] in [facility]) to become qualified for the adult dental clinic  Other Low-Cost Community Dental Services: [facility]- [address], [place], [place], [address]    [phone], Ext. [phone]    2nd and 4th Thursday of the month at 6:30am    10 clients each day by appointment, can sometimes see walk-in     patients if someone does not show for an appointment [facility]- [address], [place], [place], [address]    [phone] [facility]- [address], [place], [place], [address]    [phone]  [facility]- [phone] [facility]- [phone] [phone]- [phone]   Abscess An abscess (boil or furuncle) is an infected area on or under the skin. This area is filled with yellowish-white  fluid (pus) and other material (debris). HOME CARE   Only take medicines as told by your doctor.  If you were given antibiotic medicine, take it as directed. Finish the medicine even if you start to feel better.  If gauze is used, follow your doctor's directions for changing the gauze.  To avoid spreading the infection:  Keep your abscess covered with a bandage.  Wash your hands well.  Do not share personal care items, towels, or whirlpools with others.  Avoid skin contact with others.  Keep your skin and clothes clean around the abscess.  Keep all doctor visits as told. GET HELP RIGHT AWAY IF:   You have more pain, puffiness (swelling), or redness in the wound site.  You have more fluid or blood coming from the wound site.  You have muscle aches, chills, or you feel sick.  You have a fever. MAKE SURE YOU:   Understand these instructions.  Will watch your condition.  Will get help right away if you are not doing well or get worse. Document Released: 06/29/2007 Document Revised: 07/12/2011 Document Reviewed: 03/25/2011 [facility] Patient Information 2015 [place]. This information is not intended to replace advice given to you by your health care provider. Make sure you discuss any questions you have with your health care provider.  Dental Abscess A dental abscess is a collection of infected fluid (pus) from a bacterial infection in the inner part of the tooth (pulp). It usually occurs at the end of the tooth's root.  CAUSES   Severe tooth decay. Trau Dental Abscess A dental abscess is a collection  of infected fluid (pus) from a bacterial infection in the inner part of the tooth (pulp). It usually occurs at the end of the tooth's root.  CAUSES   Severe tooth decay.  Trauma to the tooth that allows bacteria to enter into the pulp, such as a broken or chipped tooth. SYMPTOMS   Severe pain in and around the infected tooth.  Swelling and redness around the  abscessed tooth or in the mouth or face.  Tenderness.  Pus drainage.  Bad breath.  Bitter taste in the mouth.  Difficulty swallowing.  Difficulty opening the mouth.  Nausea.  Vomiting.  Chills.  Swollen neck glands. DIAGNOSIS   A medical and dental history will be taken.  An examination will be performed by tapping on the abscessed tooth.  X-rays may be taken of the tooth to identify the abscess. TREATMENT The goal of treatment is to eliminate the infection. You may be prescribed antibiotic medicine to stop the infection from spreading. A root canal may be performed to save the tooth. If the tooth cannot be saved, it may be pulled (extracted) and the abscess may be drained.  HOME CARE INSTRUCTIONS  Only take over-the-counter or prescription medicines for pain, fever, or discomfort as directed by your caregiver.  Rinse your mouth (gargle) often with salt water ( tsp salt in 8 oz [250 ml] of warm water) to relieve pain or swelling.  Do not drive after taking pain medicine (narcotics).  Do not apply heat to the outside of your face.  Return to your dentist for further treatment as directed. SEEK MEDICAL CARE IF:  Your pain is not helped by medicine.  Your pain is getting worse instead of better. SEEK IMMEDIATE MEDICAL CARE IF:  You have a fever or persistent symptoms for more than 2-3 days.  You have a fever and your symptoms suddenly get worse.  You have chills or a very bad headache.  You have problems breathing or swallowing.  You have trouble opening your mouth.  You have swelling in the neck or around the eye. Document Released: 01/10/2005 Document Revised: 10/05/2011 Document Reviewed: 04/20/2010 [facility] Patient Information 2015 [place], [place]. This information is not intended to replace advice given to you by your health care provider. Make sure you discuss any questions you have with your health care provider.

## 2013-12-08 NOTE — ED Provider Notes (Signed)
CSN: [id number]     Arrival date & time 12/08/13  1511 History  This chart was scribed for non-physician practitioner, Wynetta EmeryNicole Dasan Hardman, PA-C,working with Lyanne CoKevin M Campos, MD, by Karle PlumberJennifer Tensley, ED Scribe. This patient was seen in room TR10C/TR10C and the patient's care was started at 4:35 PM.  Chief Complaint  Patient presents with  . Dental Pain   Patient is a 38 y.o. male presenting with tooth pain. The history is provided by the patient. No language interpreter was used.  Dental Pain Associated symptoms: facial swelling   Associated symptoms: no fever     HPI Comments:  Drew Crosby is a 38 y.o. male who presents to the Emergency Department complaining of severe left lower dental pain that began three days ago. He reports associated facial swelling and chills. Pt reports swallowing or eating makes the pain worse. Denies any alleviating factors. He has taken Motrin with no significant relief of the pain. He denies fever, nausea, vomiting.  History reviewed. No pertinent past medical history. History reviewed. No pertinent past surgical history. No family history on file. History  Substance Use Topics  . Smoking status: Current Every Day Smoker    Types: Cigarettes  . Smokeless tobacco: Not on file  . Alcohol Use: No    Review of Systems  Constitutional: Positive for chills. Negative for fever.  HENT: Positive for dental problem and facial swelling.   Gastrointestinal: Negative for nausea and vomiting.  All other systems reviewed and are negative.   Allergies  Review of patient's allergies indicates no known allergies.  Home Medications   Prior to Admission medications   Medication Sig Start Date End Date Taking? Authorizing Provider  amoxicillin (AMOXIL) 500 MG capsule Take 1 capsule (500 mg total) by mouth 3 (three) times daily. 12/08/13   Katja Blue, PA-C  ibuprofen (ADVIL,MOTRIN) 800 MG tablet Take 800 mg by mouth every 8 (eight) hours as needed. For pain     Historical Provider, MD  oxyCODONE-acetaminophen (PERCOCET/ROXICET) 5-325 MG per tablet 1 to 2 tabs PO q6hrs  PRN for pain 12/08/13   Wynetta EmeryNicole Sanuel Ladnier, PA-C   Triage Vitals: BP 115/90 mmHg  Pulse 59  Temp(Src) 98.2 F (36.8 C) (Oral)  Resp 16  Ht 5\' 6"  (1.676 m)  Wt 170 lb 1 oz (77.14 kg)  BMI 27.46 kg/m2  SpO2 97% Physical Exam  Constitutional: He is oriented to person, place, and time. He appears well-developed and well-nourished. No distress.  HENT:  Head: Normocephalic and atraumatic.  Mouth/Throat: Oropharynx is clear and moist.  Left mandibular swelling. Patient is speaking in complete sentences, handling his secretions.  Generally poor dentition, left lower posterior teeth with extensive dental caries and soft tissue swelling, no focal fluctuance or abscess. No tenderness palpation or induration under tongue.  Eyes: Conjunctivae and EOM are normal. Pupils are equal, round, and reactive to light.  Neck: Normal range of motion.  Cardiovascular: Normal rate.   Pulmonary/Chest: Effort normal. No stridor.  Abdominal: Soft.  Musculoskeletal: Normal range of motion.  Neurological: He is alert and oriented to person, place, and time.  Skin: Skin is warm and dry.  Psychiatric: He has a normal mood and affect. His behavior is normal.  Nursing note and vitals reviewed.   ED Course  Procedures (including critical care time) DIAGNOSTIC STUDIES: Oxygen Saturation is 97% on RA, normal by my interpretation.   COORDINATION OF CARE: 4:37 PM- Will prescribe pain medication and antibiotics. Will give resource guide to follow up with dentist.  Pt verbalizes understanding and agrees to plan.  Medications  oxyCODONE-acetaminophen (PERCOCET/ROXICET) 5-325 MG per tablet 1 tablet (1 tablet Oral Given 12/08/13 1646)  amoxicillin (AMOXIL) capsule 500 mg (500 mg Oral Given 12/08/13 1646)    Labs Review Labs Reviewed - No data to display  Imaging Review No results found.   EKG  Interpretation None      MDM   Final diagnoses:  Pain due to dental caries  Dental abscess    Filed Vitals:   12/08/13 1526 12/08/13 1646  BP: 115/90 116/87  Pulse: 59 60  Temp: 98.2 F (36.8 C) 98.4 F (36.9 C)  TempSrc: Oral Oral  Resp: 16 16  Height: 5\' 6"  (1.676 m)   Weight: 170 lb 1 oz (77.14 kg)   SpO2: 97% 98%    Medications  oxyCODONE-acetaminophen (PERCOCET/ROXICET) 5-325 MG per tablet 1 tablet (1 tablet Oral Given 12/08/13 1646)  amoxicillin (AMOXIL) capsule 500 mg (500 mg Oral Given 12/08/13 1646)    Drew Crosby is a 38 y.o. male presenting with dental pain and dental caries, physical exam is consistent with dental abscess. No focal areas of fluctuance that would be optimal for  Agent drainage. Will start patient on amoxicillin and Percocet.  Evaluation does not show pathology that would require ongoing emergent intervention or inpatient treatment. Pt is hemodynamically stable and mentating appropriately. Discussed findings and plan with patient/guardian, who agrees with care plan. All questions answered. Return precautions discussed and outpatient follow up given.   Discharge Medication List as of 12/08/2013  4:40 PM    START taking these medications   Details  amoxicillin (AMOXIL) 500 MG capsule Take 1 capsule (500 mg total) by mouth 3 (three) times daily., Starting 12/08/2013, Until Discontinued, Print    oxyCODONE-acetaminophen (PERCOCET/ROXICET) 5-325 MG per tablet 1 to 2 tabs PO q6hrs  PRN for pain, Print           I personally performed the services described in this documentation, which was scribed in my presence. The recorded information has been reviewed and is accurate.    Wynetta Emeryicole Ellery Meroney, PA-C 12/08/13 2157  Lyanne CoKevin M Campos, MD 12/11/13 0400

## 2014-08-27 ENCOUNTER — Emergency Department (INDEPENDENT_AMBULATORY_CARE_PROVIDER_SITE_OTHER)
Admission: EM | Admit: 2014-08-27 | Discharge: 2014-08-27 | Disposition: A | Discharge status: Home / Self Care | Payer: Self-pay | Source: Home / Self Care | Attending: Family Medicine | Admitting: Family Medicine

## 2014-08-27 ENCOUNTER — Encounter (HOSPITAL_COMMUNITY): Payer: Self-pay | Admitting: Emergency Medicine

## 2014-08-27 DIAGNOSIS — K029 Dental caries, unspecified: Secondary | ICD-10-CM

## 2014-08-27 MED ORDER — DICLOFENAC POTASSIUM 50 MG PO TABS: 50.0000 mg | ORAL_TABLET | Freq: Three times a day (TID) | ORAL | Status: DC

## 2014-08-27 MED ORDER — CLINDAMYCIN HCL 300 MG PO CAPS: 300.0000 mg | ORAL_CAPSULE | Freq: Three times a day (TID) | ORAL | Status: AC

## 2014-08-27 NOTE — Discharge Instructions (Signed)
Take medicine as prescribed, see your dentist as soon as possible °

## 2014-08-27 NOTE — ED Notes (Signed)
C/o right top wisdom teeth pain States tooth broke while eating Ibuprofen, ice, gargling with peroxide was used as tx

## 2014-08-27 NOTE — ED Provider Notes (Signed)
CSN: [id number]     Arrival date & time 08/27/14  1510 History   First MD Initiated Contact with Patient 08/27/14 1555     Chief Complaint  Patient presents with  . Dental Pain   (Consider location/radiation/quality/duration/timing/severity/associated sxs/prior Treatment) Patient is a 39 y.o. male presenting with tooth pain. The history is provided by the patient.  Dental Pain Location:  Upper Upper teeth location:  1/RU 3rd molar Quality:  Throbbing Severity:  Moderate Onset quality:  Gradual Duration:  1 week Progression:  Worsening Chronicity:  New Context: dental caries, dental fracture and poor dentition   Relieved by:  None tried Worsened by:  Nothing tried Ineffective treatments:  None tried Associated symptoms: no difficulty swallowing, no drooling, no facial swelling and no fever   Risk factors: lack of dental care and smoking     History reviewed. No pertinent past medical history. History reviewed. No pertinent past surgical history. History reviewed. No pertinent family history. History  Substance Use Topics  . Smoking status: Current Every Day Smoker    Types: Cigarettes  . Smokeless tobacco: Not on file  . Alcohol Use: No    Review of Systems  Constitutional: Negative.  Negative for fever.  HENT: Positive for dental problem. Negative for drooling and facial swelling.     Allergies  Review of patient's allergies indicates no known allergies.  Home Medications   Prior to Admission medications   Medication Sig Start Date End Date Taking? Authorizing Provider  amoxicillin (AMOXIL) 500 MG capsule Take 1 capsule (500 mg total) by mouth 3 (three) times daily. 12/08/13   Nicole Pisciotta, PA-C  clindamycin (CLEOCIN) 300 MG capsule Take 1 capsule (300 mg total) by mouth 3 (three) times daily. 08/27/14   Linna Hoff, MD  diclofenac (CATAFLAM) 50 MG tablet Take 1 tablet (50 mg total) by mouth 3 (three) times daily. For dental pain 08/27/14   Linna Hoff, MD   ibuprofen (ADVIL,MOTRIN) 800 MG tablet Take 800 mg by mouth every 8 (eight) hours as needed. For pain    Historical Provider, MD  oxyCODONE-acetaminophen (PERCOCET/ROXICET) 5-325 MG per tablet 1 to 2 tabs PO q6hrs  PRN for pain 12/08/13   Nicole Pisciotta, PA-C   BP 128/86 mmHg  Pulse 54  Temp(Src) 98.4 F (36.9 C) (Oral)  Resp 16  SpO2 100% Physical Exam  Constitutional: He is oriented to person, place, and time. He appears well-developed and well-nourished. He appears distressed.  HENT:  Mult dental caries and poor dentition upper and lower teeth.  Neck: Normal range of motion. Neck supple.  Lymphadenopathy:    He has no cervical adenopathy.  Neurological: He is alert and oriented to person, place, and time.  Skin: Skin is warm and dry.  Nursing note and vitals reviewed.   ED Course  Procedures (including critical care time) Labs Review Labs Reviewed - No data to display  Imaging Review No results found.   MDM   1. Pain due to dental caries        Linna Hoff, MD 08/27/14 [phone]

## 2014-11-19 ENCOUNTER — Emergency Department (INDEPENDENT_AMBULATORY_CARE_PROVIDER_SITE_OTHER): Payer: Self-pay

## 2014-11-19 ENCOUNTER — Encounter (HOSPITAL_COMMUNITY): Payer: Self-pay | Admitting: *Deleted

## 2014-11-19 ENCOUNTER — Emergency Department (INDEPENDENT_AMBULATORY_CARE_PROVIDER_SITE_OTHER)
Admission: EM | Admit: 2014-11-19 | Discharge: 2014-11-19 | Disposition: A | Discharge status: Home / Self Care | Payer: Self-pay | Source: Home / Self Care | Attending: Family Medicine | Admitting: Family Medicine

## 2014-11-19 DIAGNOSIS — S76911A Strain of unspecified muscles, fascia and tendons at thigh level, right thigh, initial encounter: Secondary | ICD-10-CM

## 2014-11-19 DIAGNOSIS — W102XXA Fall (on)(from) incline, initial encounter: Secondary | ICD-10-CM

## 2014-11-19 MED ORDER — DICLOFENAC POTASSIUM 50 MG PO TABS: 50.0000 mg | ORAL_TABLET | Freq: Three times a day (TID) | ORAL | Status: DC

## 2014-11-19 NOTE — ED Provider Notes (Signed)
CSN: [id number]     Arrival date & time 11/19/14  1303 History   First MD Initiated Contact with Patient 11/19/14 1354     Chief Complaint  Patient presents with  . Fall   (Consider location/radiation/quality/duration/timing/severity/associated sxs/prior Treatment) Patient is a 39 y.o. male presenting with fall. The history is provided by the patient.  Fall This is a new problem. The current episode started more than 1 week ago (fell otj landing on right side with primary c/o knee and thigh pain continuing). The problem has not changed since onset.Pertinent negatives include no abdominal pain.    History reviewed. No pertinent past medical history. History reviewed. No pertinent past surgical history. History reviewed. No pertinent family history. Social History  Substance Use Topics  . Smoking status: Current Every Day Smoker    Types: Cigarettes  . Smokeless tobacco: None  . Alcohol Use: No    Review of Systems  Cardiovascular: Negative.   Gastrointestinal: Negative for abdominal pain.  Genitourinary: Negative for flank pain.  Musculoskeletal: Negative for back pain, joint swelling and gait problem.  Skin: Negative.   All other systems reviewed and are negative.   Allergies  Review of patient's allergies indicates no known allergies.  Home Medications   Prior to Admission medications   Medication Sig Start Date End Date Taking? Authorizing Provider  amoxicillin (AMOXIL) 500 MG capsule Take 1 capsule (500 mg total) by mouth 3 (three) times daily. 12/08/13   Nicole Pisciotta, PA-C  clindamycin (CLEOCIN) 300 MG capsule Take 1 capsule (300 mg total) by mouth 3 (three) times daily. 08/27/14   Linna HoffJames D Massimiliano Rohleder, MD  diclofenac (CATAFLAM) 50 MG tablet Take 1 tablet (50 mg total) by mouth 3 (three) times daily. For leg pain 11/19/14   Linna HoffJames D Alexzandra Bilton, MD  ibuprofen (ADVIL,MOTRIN) 800 MG tablet Take 800 mg by mouth every 8 (eight) hours as needed. For pain    Historical Provider, MD   oxyCODONE-acetaminophen (PERCOCET/ROXICET) 5-325 MG per tablet 1 to 2 tabs PO q6hrs  PRN for pain 12/08/13   Joni ReiningNicole Pisciotta, PA-C   Meds Ordered and Administered this Visit  Medications - No data to display  BP 102/70 mmHg  Pulse 68  Temp(Src) 98 F (36.7 C) (Oral)  Resp 16  SpO2 99% No data found.   Physical Exam  Constitutional: He is oriented to person, place, and time. He appears well-developed and well-nourished.  Abdominal: Soft. Bowel sounds are normal. He exhibits no distension and no mass. There is no tenderness. There is no rebound and no guarding.  Musculoskeletal: He exhibits tenderness.       Right knee: He exhibits normal range of motion, no swelling, no effusion, normal alignment and normal patellar mobility.  Neurological: He is alert and oriented to person, place, and time.  Skin: Skin is warm and dry.  Nursing note and vitals reviewed.   ED Course  Procedures (including critical care time)  Labs Review Labs Reviewed - No data to display  Imaging Review Dg Femur 1v Right  11/19/2014  CLINICAL DATA:  Status post fall with injury and pain to the distal right femur. EXAM: RIGHT FEMUR 1 VIEW COMPARISON:  None. FINDINGS: There is no evidence of fracture or other focal bone lesions. Soft tissues are unremarkable. IMPRESSION: No acute fracture or dislocation identified about the right femur. Electronically Signed   By: Ted Mcalpineobrinka  Dimitrova M.D.   On: 11/19/2014 14:33    X-rays reviewed and report per radiologist.  Visual Acuity Review  Right Eye Distance:   Left Eye Distance:   Bilateral Distance:    Right Eye Near:   Left Eye Near:    Bilateral Near:         MDM   1. Muscle strain of thigh, right, initial encounter   2. Fall (on)(from) incline, initial encounter        Linna Hoff, MD 11/20/14 1313

## 2014-11-19 NOTE — Discharge Instructions (Signed)
Use medicine as needed and see orthopedist if further problems. °

## 2014-11-19 NOTE — ED Notes (Signed)
Pt  Reports he  Felled  8  Days   Ago  And   injured  His  r  Leg  Knee        He  Reports  Pain       When  He  Bears  Weight          Pt  Ambulated   To  Room  With a  Steady  Fluid  Gait   -        Pt  Is sitting   Upright on the  Exam table  Speaking in  Complete  sentances

## 2014-12-29 ENCOUNTER — Encounter (HOSPITAL_COMMUNITY): Payer: Self-pay | Admitting: *Deleted

## 2014-12-29 ENCOUNTER — Emergency Department (HOSPITAL_COMMUNITY)
Admission: EM | Admit: 2014-12-29 | Discharge: 2014-12-29 | Discharge status: Against Medical Advice | Payer: Self-pay | Attending: Emergency Medicine | Admitting: Emergency Medicine

## 2014-12-29 DIAGNOSIS — S79912A Unspecified injury of left hip, initial encounter: Secondary | ICD-10-CM | POA: Insufficient documentation

## 2014-12-29 DIAGNOSIS — S8991XA Unspecified injury of right lower leg, initial encounter: Secondary | ICD-10-CM | POA: Insufficient documentation

## 2014-12-29 DIAGNOSIS — S79911A Unspecified injury of right hip, initial encounter: Secondary | ICD-10-CM | POA: Insufficient documentation

## 2014-12-29 DIAGNOSIS — Y990 Civilian activity done for income or pay: Secondary | ICD-10-CM | POA: Insufficient documentation

## 2014-12-29 DIAGNOSIS — M791 Myalgia, unspecified site: Secondary | ICD-10-CM

## 2014-12-29 DIAGNOSIS — Y9389 Activity, other specified: Secondary | ICD-10-CM | POA: Insufficient documentation

## 2014-12-29 DIAGNOSIS — S4991XA Unspecified injury of right shoulder and upper arm, initial encounter: Secondary | ICD-10-CM | POA: Insufficient documentation

## 2014-12-29 DIAGNOSIS — W1789XA Other fall from one level to another, initial encounter: Secondary | ICD-10-CM | POA: Insufficient documentation

## 2014-12-29 DIAGNOSIS — S4992XA Unspecified injury of left shoulder and upper arm, initial encounter: Secondary | ICD-10-CM | POA: Insufficient documentation

## 2014-12-29 DIAGNOSIS — F17210 Nicotine dependence, cigarettes, uncomplicated: Secondary | ICD-10-CM | POA: Insufficient documentation

## 2014-12-29 DIAGNOSIS — W19XXXA Unspecified fall, initial encounter: Secondary | ICD-10-CM

## 2014-12-29 DIAGNOSIS — Y9289 Other specified places as the place of occurrence of the external cause: Secondary | ICD-10-CM | POA: Insufficient documentation

## 2014-12-29 MED ORDER — CYCLOBENZAPRINE HCL 10 MG PO TABS: 10.0000 mg | ORAL_TABLET | Freq: Two times a day (BID) | ORAL | Status: DC | PRN

## 2014-12-29 MED ORDER — CYCLOBENZAPRINE HCL 10 MG PO TABS: 10.0000 mg | ORAL_TABLET | Freq: Two times a day (BID) | ORAL | Status: AC | PRN

## 2014-12-29 MED ORDER — TRAMADOL HCL 50 MG PO TABS: 50.0000 mg | ORAL_TABLET | Freq: Four times a day (QID) | ORAL | Status: DC | PRN

## 2014-12-29 MED ORDER — NAPROXEN 500 MG PO TABS: 500.0000 mg | ORAL_TABLET | Freq: Two times a day (BID) | ORAL | Status: DC

## 2014-12-29 MED ORDER — NAPROXEN 375 MG PO TABS: 375.0000 mg | ORAL_TABLET | Freq: Two times a day (BID) | ORAL | Status: AC

## 2014-12-29 NOTE — ED Notes (Signed)
Pt reports falling approx 2 weeks ago, fell from 3rd floor to 2nd floor at work. Having pain to bilateral shoulders, hips and right knee. Ambulatory at triage.

## 2014-12-29 NOTE — ED Notes (Signed)
SEE PA assessment 

## 2014-12-29 NOTE — ED Notes (Signed)
Declined W/C at D/C and was escorted to lobby by RN. 

## 2014-12-29 NOTE — Discharge Instructions (Signed)
Muscle Pain, Adult  Muscle pain (myalgia) may be caused by many things, including:  · Overuse or muscle strain, especially if you are not in shape. This is the most common cause of muscle pain.  · Injury.  · Bruises.  · Viruses, such as the flu.  · Infectious diseases.  · Fibromyalgia, which is a chronic condition that causes muscle tenderness, fatigue, and headache.  · Autoimmune diseases, including lupus.  · Certain drugs, including ACE inhibitors and statins.  Muscle pain may be mild or severe. In most cases, the pain lasts only a short time and goes away without treatment. To diagnose the cause of your muscle pain, your health care provider will take your medical history. This means he or she will ask you when your muscle pain began and what has been happening. If you have not had muscle pain for very long, your health care provider may want to wait before doing much testing. If your muscle pain has lasted a long time, your health care provider may want to run tests right away. If your health care provider thinks your muscle pain may be caused by illness, you may need to have additional tests to rule out certain conditions.   Treatment for muscle pain depends on the cause. Home care is often enough to relieve muscle pain. Your health care provider may also prescribe anti-inflammatory medicine.  HOME CARE INSTRUCTIONS  Watch your condition for any changes. The following actions may help to lessen any discomfort you are feeling:  · Only take over-the-counter or prescription medicines as directed by your health care provider.  · Apply ice to the sore muscle:    Put ice in a plastic bag.    Place a towel between your skin and the bag.    Leave the ice on for 15-20 minutes, 3-4 times a day.  · You may alternate applying hot and cold packs to the muscle as directed by your health care provider.  · If overuse is causing your muscle pain, slow down your activities until the pain goes away.    Remember that it is normal  to feel some muscle pain after starting a workout program. Muscles that have not been used often will be sore at first.    Do regular, gentle exercises if you are not usually active.    Warm up before exercising to lower your risk of muscle pain.  · Do not continue working out if the pain is very bad. Bad pain could mean you have injured a muscle.  SEEK MEDICAL CARE IF:  · Your muscle pain gets worse, and medicines do not help.  · You have muscle pain that lasts longer than 3 days.  · You have a rash or fever along with muscle pain.  · You have muscle pain after a tick bite.  · You have muscle pain while working out, even though you are in good physical condition.  · You have redness, soreness, or swelling along with muscle pain.  · You have muscle pain after starting a new medicine or changing the dose of a medicine.  SEEK IMMEDIATE MEDICAL CARE IF:  · You have trouble breathing.  · You have trouble swallowing.  · You have muscle pain along with a stiff neck, fever, and vomiting.  · You have severe muscle weakness or cannot move part of your body.  MAKE SURE YOU:   · Understand these instructions.  · Will watch your condition.  · Will get   help right away if you are not doing well or get worse.     This information is not intended to replace advice given to you by your health care provider. Make sure you discuss any questions you have with your health care provider.     Document Released: 12/02/2005 Document Revised: 01/31/2014 Document Reviewed: 11/06/2012  [employer]vier Interactive Patient Education ©2016 [employer]

## 2015-02-14 ENCOUNTER — Encounter (HOSPITAL_COMMUNITY): Payer: Self-pay | Admitting: *Deleted

## 2015-02-14 ENCOUNTER — Emergency Department (HOSPITAL_COMMUNITY)
Admission: EM | Admit: 2015-02-14 | Discharge: 2015-02-14 | Disposition: A | Discharge status: Home / Self Care | Payer: Self-pay | Attending: Emergency Medicine | Admitting: Emergency Medicine

## 2015-02-14 DIAGNOSIS — Z792 Long term (current) use of antibiotics: Secondary | ICD-10-CM | POA: Insufficient documentation

## 2015-02-14 DIAGNOSIS — K029 Dental caries, unspecified: Secondary | ICD-10-CM | POA: Insufficient documentation

## 2015-02-14 DIAGNOSIS — K047 Periapical abscess without sinus: Secondary | ICD-10-CM

## 2015-02-14 DIAGNOSIS — F17210 Nicotine dependence, cigarettes, uncomplicated: Secondary | ICD-10-CM | POA: Insufficient documentation

## 2015-02-14 MED ORDER — PENICILLIN V POTASSIUM 250 MG PO TABS
500.0000 mg | ORAL_TABLET | Freq: Once | ORAL | Status: AC
  Administered 2015-02-14: 500 mg via ORAL
  Filled 2015-02-14: qty 2

## 2015-02-14 MED ORDER — OXYCODONE-ACETAMINOPHEN 5-325 MG PO TABS: 2.0000 | ORAL_TABLET | ORAL | Status: AC | PRN

## 2015-02-14 MED ORDER — OXYCODONE-ACETAMINOPHEN 5-325 MG PO TABS
1.0000 | ORAL_TABLET | Freq: Once | ORAL | Status: AC
  Administered 2015-02-14: 1 via ORAL
  Filled 2015-02-14: qty 1

## 2015-02-14 MED ORDER — PENICILLIN V POTASSIUM 500 MG PO TABS: 500.0000 mg | ORAL_TABLET | Freq: Four times a day (QID) | ORAL | Status: AC

## 2015-02-14 NOTE — ED Provider Notes (Signed)
CSN: [id number]     Arrival date & time 02/14/15  1314 History   First MD Initiated Contact with Patient 02/14/15 1352     Chief Complaint  Patient presents with  . Dental Pain     (Consider location/radiation/quality/duration/timing/severity/associated sxs/prior Treatment) HPI  Gregor Dershem is a 40 y.o M with no significant past medical history presents to the emergency department today complaining of dental pain. Patient states that yesterday morning his left lower molar began aching. He has taken several ibuprofen with no relief. Today when he woke up he states that he felt his gums were swollen. Patient has had problems in the past for which she saw a dentist for in August of last year. Patient states they were going to require 5 or $600 to pull his teeth and patient was unable to afford that at this time. Denies lip or tongue swelling, fevers, chills, difficulty swallowing, difficulty breathing. History reviewed. No pertinent past medical history. History reviewed. No pertinent past surgical history. No family history on file. Social History  Substance Use Topics  . Smoking status: Current Every Day Smoker -- 0.00 packs/day    Types: Cigarettes  . Smokeless tobacco: None  . Alcohol Use: No    Review of Systems  All other systems reviewed and are negative.     Allergies  Review of patient's allergies indicates no known allergies.  Home Medications   Prior to Admission medications   Medication Sig Start Date End Date Taking? Authorizing Provider  amoxicillin (AMOXIL) 500 MG capsule Take 1 capsule (500 mg total) by mouth 3 (three) times daily. 12/08/13   Nicole Pisciotta, PA-C  clindamycin (CLEOCIN) 300 MG capsule Take 1 capsule (300 mg total) by mouth 3 (three) times daily. 08/27/14   Linna Hoff, MD  cyclobenzaprine (FLEXERIL) 10 MG tablet Take 1 tablet (10 mg total) by mouth 2 (two) times daily as needed for muscle spasms. 12/29/14   Arthor Captain, PA-C  naproxen  (NAPROSYN) 375 MG tablet Take 1 tablet (375 mg total) by mouth 2 (two) times daily with a meal. 12/29/14   Arthor Captain, PA-C   BP 127/79 mmHg  Pulse 61  Temp(Src) 98.2 F (36.8 C) (Oral)  Resp 18  Ht  (1.676 m)  Wt 77.111 kg  BMI 27.45 kg/m2  SpO2 98% Physical Exam  Constitutional: He is oriented to person, place, and time. He appears well-developed and well-nourished. No distress.  HENT:  Head: Normocephalic and atraumatic.  Mouth/Throat: Oropharynx is clear and moist and mucous membranes are normal. He does not have dentures. No oral lesions. No trismus in the jaw. Abnormal dentition. Dental caries present. No uvula swelling or lacerations. No oropharyngeal exudate, posterior oropharyngeal edema, posterior oropharyngeal erythema or tonsillar abscesses.    Eyes: Conjunctivae are normal. Right eye exhibits no discharge. Left eye exhibits no discharge. No scleral icterus.  Cardiovascular: Normal rate.   Pulmonary/Chest: Effort normal.  Neurological: He is alert and oriented to person, place, and time. Coordination normal.  Skin: Skin is warm and dry. No rash noted. He is not diaphoretic. No erythema. No pallor.  Psychiatric: He has a normal mood and affect. His behavior is normal.  Nursing note and vitals reviewed.   ED Course  Procedures (including critical care time) Labs Review Labs Reviewed - No data to display  Imaging Review No results found. I have personally reviewed and evaluated these images and lab results as part of my medical decision-making.   EKG Interpretation None  MDM   Final diagnoses:  Dental abscess    Patient with toothache. No drainable abscess on exam. Pt with very poor dentition. Exam unconcerning for Ludwig's angina or spread of infection.  Will treat with penicillin and pain medicine.  Urged patient to follow-up with dentist.       Dub Mikes, PA-C 02/15/15 2229  Pricilla Loveless, MD 02/17/15 [phone]

## 2015-02-14 NOTE — Discharge Instructions (Signed)
Dental Abscess A dental abscess is a collection of pus in or around a tooth. CAUSES This condition is caused by a bacterial infection around the root of the tooth that involves the inner part of the tooth (pulp). It may result from:  Severe tooth decay.  Trauma to the tooth that allows bacteria to enter into the pulp, such as a broken or chipped tooth.  Severe gum disease around a tooth. SYMPTOMS Symptoms of this condition include:  Severe pain in and around the infected tooth.  Swelling and redness around the infected tooth, in the mouth, or in the face.  Tenderness.  Pus drainage.  Bad breath.  Bitter taste in the mouth.  Difficulty swallowing.  Difficulty opening the mouth.  Nausea.  Vomiting.  Chills.  Swollen neck glands.  Fever. DIAGNOSIS This condition is diagnosed with examination of the infected tooth. During the exam, your dentist may tap on the infected tooth. Your dentist will also ask about your medical and dental history and may order X-rays. TREATMENT This condition is treated by eliminating the infection. This may be done with:  Antibiotic medicine.  A root canal. This may be performed to save the tooth.  Pulling (extracting) the tooth. This may also involve draining the abscess. This is done if the tooth cannot be saved. HOME CARE INSTRUCTIONS  Take medicines only as directed by your dentist.  If you were prescribed antibiotic medicine, finish all of it even if you start to feel better.  Rinse your mouth (gargle) often with salt water to relieve pain or swelling.  Do not drive or operate heavy machinery while taking pain medicine.  Do not apply heat to the outside of your mouth.  Keep all follow-up visits as directed by your dentist. This is important. SEEK MEDICAL CARE IF:  Your pain is worse and is not helped by medicine. SEEK IMMEDIATE MEDICAL CARE IF:  You have a fever or chills.  Your symptoms suddenly get worse.  You have a  very bad headache.  You have problems breathing or swallowing.  You have trouble opening your mouth.  You have swelling in your neck or around your eye.   This information is not intended to replace advice given to you by your health care provider. Make sure you discuss any questions you have with your health care provider.    Emergency Department Resource Guide 1) Find a Doctor and Pay Out of Pocket Although you won't have to find out who is covered by your insurance plan, it is a good idea to ask around and get recommendations. You will then need to call the office and see if the doctor you have chosen will accept you as a new patient and what types of options they offer for patients who are self-pay. Some doctors offer discounts or will set up payment plans for their patients who do not have insurance, but you will need to ask so you aren't surprised when you get to your appointment.  2) Contact Your Local Health Department Not all health departments have doctors that can see patients for sick visits, but many do, so it is worth a call to see if yours does. If you don't know where your local health department is, you can check in your phone book. The CDC also has a tool to help you locate your state's health department, and many state websites also have listings of all of their local health departments.  3) Find a Walk-in Clinic If your illness  is not likely to be very severe or complicated, you may want to try a walk in clinic. These are popping up all over the country in pharmacies, drugstores, and shopping centers. They're usually staffed by nurse practitioners or physician assistants that have been trained to treat common illnesses and complaints. They're usually fairly quick and inexpensive. However, if you have serious medical issues or chronic medical problems, these are probably not your best option.  No Primary Care Doctor: - Call Health Connect at  [phone] - they can help you  locate a primary care doctor that  accepts your insurance, provides certain services, etc. - Physician Referral Service- [phone]  Chronic Pain Problems: Organization         Address  Phone   Notes  [facility]  [phone] Patients need to be referred by their primary care doctor.   Medication Assistance: Organization         Address  Phone   Notes  [facility] Medication [facility] [address] [place] [place], [place] [address] [phone] --Must be a resident of [facility] -- Must have NO insurance coverage whatsoever (no Medicaid/ Medicare, etc.) -- The pt. MUST have a primary care doctor that directs their care regularly and follows them in the community   [facility]  [phone]   [employer]  [phone]    Agencies that provide inexpensive medical care: Organization         Address  Phone   Notes  [facility]  [phone]   [facility]    [phone]   [facility] [address] [place], [place] [address] [phone]   [facility] [address], [place] [phone]   Planned Parenthood    [phone]   [facility]    [phone]   [facility]  [address], [place] Phone:  [phone], Fax:  [phone] Hours of Operation:  9 am - 6 pm, M-F.  Also accepts Medicaid/Medicare and self-pay.  [facility]  [address], [place] Phone: [phone], Fax: [phone]. Hours of Operation:  8:30 am - 5:30 pm, M-F.  Also accepts Medicaid and self-pay.  [facility] [address] [place] Phone: [phone]   [facility] [address] Natasha Bence [place], [place] [phone], Ext. [phone] Mondays & Thursdays: 7-9 AM.  First 15 patients are seen on a first come, first serve basis.    Medicaid-accepting [facility]  Providers:  Organization         Address  Phone   Notes  [facility] [address], [place] [phone] Also accepts self-pay patients.  [facility] [address] Laurell Josephs [place], [place]  [phone]   [facility] [address], [place] [phone]   [facility] [address], [place] [phone]   Renaye Rakers [address], [place]   [phone] Only accepts [place] Access [place] patients after they have their name applied to their card.   Self-Pay (no insurance) in [facility]:  Organization         Address  Phone   Notes  Sickle Cell Patients, [facility] [address] [place], [place] [phone]  [facility] [place] [phone]   [facility] [place]  [place], [place] [phone]   [facility]/Dr. Osei-Bonsu  [address] [place] [address], [place] [phone] Phone number for both [place] and [place] locations is the same.  [facility] [address], [place] [phone]   [facility] [address], [place] or [address] [phone]   [facility] [address], [place] [phone], phone; [phone], fax Sees patients 1st and 3rd Saturday of every month.  Must not qualify for public or private insurance (i.e. Medicaid, Medicare, [place] Health Choice, Veterans' Benefits)  Household income should be no more than 200% of the poverty level The clinic cannot treat you if you are pregnant or think you are pregnant  Sexually transmitted diseases are not treated at the clinic.    Dental Care: Organization         Address  Phone  Notes  [facility] [place] [phone] Accepts children up to age 34 who are enrolled in [place] or [place]; pregnant women with a Medicaid card; and children who have applied for Medicaid or [place] Health Choice, but were declined, whose parents can pay a reduced fee at time of service.  [facility]  [address], [place] [phone] Accepts children up to age 34 who are enrolled in [place] or [place]; pregnant women with a Medicaid card; and children who have applied for Medicaid or  Health Choice, but were declined, whose parents can pay a reduced fee at time of service.  [facility]  [place] [phone] Patients are seen by appointment only. Walk-ins are not accepted. [place] will see patients 69 years of age and older. Monday - Tuesday (8am-5pm) Most Wednesdays (8:30-5pm) $30 per visit, cash only  [facility] Adult [facility]  [address], [facility] [phone] Patients are seen by appointment only. Walk-ins are not accepted. [place] will see patients 28 years of age and older. One Wednesday Evening (Monthly: Volunteer Based).  $30 per visit, cash only  [place]  [phone] for adults; Children under age 32, call Graduate Pediatric Dentistry at [phone]. Children aged 77-14, please call [phone] to request a pediatric application.  Dental services are provided in all areas of dental care including fillings, crowns and bridges, complete and partial dentures, implants, gum treatment, root canals, and extractions. Preventive care is also provided. Treatment is provided to both adults and children. Patients are selected via a lottery and there is often a waiting list.   [facility] [address] [place]  [phone] [email or web address]   [facility]  [address] [place], [place] [phone], Ext. [phone] Second and Fourth Thursday of each month, opens at 6:30 AM; Clinic ends at 9 AM.  Patients are seen on a first-come first-served basis, and a limited number are seen during each clinic.   [facility]  [address] Hillard Danker [place], [place] [phone]   Eligibility Requirements You must have lived in [place], [place], or [place] counties for at least the  last three months.   You cannot be eligible for state or federal sponsored [place], including [employer], [place], or [employer].   You generally cannot be eligible for healthcare insurance through your employer.    How to apply: Eligibility screenings are held every Tuesday and Wednesday afternoon from 1:00 pm until 4:00 pm. You do not need an appointment for the interview!  [facility] [address], [place], [place] [phone]   [facility] Health Department  [phone]   [facility] Health Department  [phone]   [facility] Health Department  [phone]    Behavioral Health Resources in the Community: Intensive Outpatient Programs Organization         Address  Phone  Notes  [facility] [address] [place], [place] [phone]   [facility] [address] [place], [place] [phone]   [facility] [address], [place], [place]  [phone]   [facility] [address],  [place], [place] [phone] or [phone]   Substance Abuse Resources Organization         Address  Phone  Notes  [facility]  [phone]   [facility]  [phone]   [facility] [place]  [phone]   Floydene Flock  [phone]   Residential & Outpatient Substance Abuse Program  [phone]   Psychological Services Organization         Address  Phone  Notes  [facility]  [phone]     [facility]  [phone]   [facility] [address], [place] [phone] or [phone]    Mobile Crisis Teams Organization         Address  Phone  Notes  [facility], Mobile Crisis Care Unit  [phone]   [facility]  [address] [place], [place] [phone]   Doristine Locks [address] [place] [phone]    Self-Help/Support Groups Organization         Address  Phone             Notes  [facility] [place] - variety of support groups  336- I7437963 Call for more information  Narcotics Anonymous (NA), [facility] [address], [employer] [place]  2 meetings at this location   Statistician         Address  Phone  Notes  [facility] [address],    [place]  [phone]   [facility]  [address] [place] [address], [place], [place] [phone]   [facility] [address] [place], [place] [phone] Admissions: 8am-3pm M-F  [facility] [address],    [place], [place] [phone]   [facility] [address] [place], [place], [place] [phone]   [facility] [address]  [place], [place] [phone]   [facility] - Intensive Outpatient [address] Laurell Josephs [address], [place], [place] [phone]   [facility] (Addiction Recovery [facility] [address]  [place], [place] [phone] or [phone]   [facility] ([facility]) [address] [place], [place] [phone] Accepts Medicaid  [facility] [address]  [place] [phone] Substance Abuse/Addiction Treatment   [facility] Organization         Address  Phone  Notes  [facility]  2176237039  Angie Fava, PhD [address] Ervin Knack [place], [place]   [phone] or [phone]    [facility] Behavioral   [address] [place], [place] [phone]   [facility] [address], [place], [place] [phone] Insurance/Medicaid/sponsorship through [facility] Families [address]                                    [place], [place] [phone] Therapy/tele-psych/case  [facility] [address], [place] [phone]    Dr. Lolly Mustache  [phone]   [facility]  [employer] [facility] Dept. 1) [address] [place] 2) [address] [place] 3)  [address], [place] [phone]  [phone]   [facility] Child Abuse Hotline 712-087-2220 or [phone] (After Hours)      Follow-up with your dentist as soon as possible for consultation and reevaluation. Take antibiotics as prescribed. Take ibuprofen as needed for pain. Return to the emergency department if you experience severe increase in your pain, increased facial swelling, difficulty breathing, difficulty swallowing, fever, chills.

## 2015-02-14 NOTE — ED Notes (Signed)
Declined W/C at D/C and was escorted to lobby by RN. 

## 2015-02-14 NOTE — ED Notes (Signed)
Pt states L lower dental pain since yesterday.

## 2016-09-22 ENCOUNTER — Encounter (HOSPITAL_COMMUNITY): Payer: Self-pay

## 2016-09-22 ENCOUNTER — Emergency Department (HOSPITAL_COMMUNITY)
Admission: EM | Admit: 2016-09-22 | Discharge: 2016-09-22 | Disposition: A | Discharge status: Home / Self Care | Payer: Self-pay | Attending: Emergency Medicine | Admitting: Emergency Medicine

## 2016-09-22 DIAGNOSIS — Y93E5 Activity, floor mopping and cleaning: Secondary | ICD-10-CM | POA: Insufficient documentation

## 2016-09-22 DIAGNOSIS — W268XXA Contact with other sharp object(s), not elsewhere classified, initial encounter: Secondary | ICD-10-CM | POA: Insufficient documentation

## 2016-09-22 DIAGNOSIS — Z23 Encounter for immunization: Secondary | ICD-10-CM | POA: Insufficient documentation

## 2016-09-22 DIAGNOSIS — Y998 Other external cause status: Secondary | ICD-10-CM | POA: Insufficient documentation

## 2016-09-22 DIAGNOSIS — Z79899 Other long term (current) drug therapy: Secondary | ICD-10-CM | POA: Insufficient documentation

## 2016-09-22 DIAGNOSIS — F17210 Nicotine dependence, cigarettes, uncomplicated: Secondary | ICD-10-CM | POA: Insufficient documentation

## 2016-09-22 DIAGNOSIS — S81812A Laceration without foreign body, left lower leg, initial encounter: Secondary | ICD-10-CM | POA: Insufficient documentation

## 2016-09-22 DIAGNOSIS — Y929 Unspecified place or not applicable: Secondary | ICD-10-CM | POA: Insufficient documentation

## 2016-09-22 MED ORDER — CEPHALEXIN 500 MG PO CAPS: 500.0000 mg | ORAL_CAPSULE | Freq: Four times a day (QID) | ORAL | 0 refills | Status: AC

## 2016-09-22 MED ORDER — TETANUS-DIPHTH-ACELL PERTUSSIS 5-2.5-18.5 LF-MCG/0.5 IM SUSP
0.5000 mL | Freq: Once | INTRAMUSCULAR | Status: AC
  Administered 2016-09-22: 0.5 mL via INTRAMUSCULAR
  Filled 2016-09-22: qty 0.5

## 2016-09-22 NOTE — Discharge Instructions (Signed)
Please take 1 tab of Keflex twice a day for 3-5 days.  If no signs of infection present please discontinue taking medication.  If signs of infection present including redness, swelling, significant drainage, take 1 tab 4 times per day for the entire duration prescribed.  Please return if symptoms do not improve or worsen.  Please read attached information.

## 2016-09-22 NOTE — ED Triage Notes (Signed)
Pt states he was moving furniture Saturday and got laceration to left leg, shin area. Laceration is approximately 3 inches. No pus like drainage noted. Unknown when last teatnus shot was.

## 2016-09-22 NOTE — ED Provider Notes (Signed)
[facility]PT Provider Note   CSN: [id number] Arrival date & time: 09/22/16  1002    History   Chief Complaint Chief Complaint  Patient presents with  . Laceration    HPI Drew Crosby is a 41 y.o. male.  HPI   41 year old male presents today with a laceration to his left leg.  Patient notes 5 days ago that he was cleaning out an old house when he cut it on a piece of metal.  He notes he has been keeping it clean with peroxide and alcohol.  He notes clear drainage, no purulent drainage, no surrounding redness or swelling.  Patient's tetanus status is unknown.  He denies any other complaints, no significant past medical history.  No medications prior to arrival.   History reviewed. No pertinent past medical history.  There are no active problems to display for this patient.   History reviewed. No pertinent surgical history.     Home Medications    Prior to Admission medications   Medication Sig Start Date End Date Taking? Authorizing Provider  amoxicillin (AMOXIL) 500 MG capsule Take 1 capsule (500 mg total) by mouth 3 (three) times daily. 12/08/13   Pisciotta, Joni ReiningNicole, PA-C  cephALEXin (KEFLEX) 500 MG capsule Take 1 capsule (500 mg total) by mouth 4 (four) times daily. 09/22/16   Selenia Mihok, Tinnie GensJeffrey, PA-C  clindamycin (CLEOCIN) 300 MG capsule Take 1 capsule (300 mg total) by mouth 3 (three) times daily. 08/27/14   Linna HoffKindl, Gualberto D, MD  cyclobenzaprine (FLEXERIL) 10 MG tablet Take 1 tablet (10 mg total) by mouth 2 (two) times daily as needed for muscle spasms. 12/29/14   Arthor CaptainHarris, Abigail, PA-C  naproxen (NAPROSYN) 375 MG tablet Take 1 tablet (375 mg total) by mouth 2 (two) times daily with a meal. 12/29/14   Arthor CaptainHarris, Abigail, PA-C  oxyCODONE-acetaminophen (PERCOCET/ROXICET) 5-325 MG tablet Take 2 tablets by mouth every 4 (four) hours as needed for severe pain. 02/14/15   Dowless, Lester KinsmanSamantha Tripp, PA-C    Family History History reviewed. No pertinent family history.  Social  History Social History  Substance Use Topics  . Smoking status: Current Every Day Smoker    Packs/day: 0.50    Types: Cigarettes  . Smokeless tobacco: Never Used  . Alcohol use No     Allergies   Patient has no known allergies.   Review of Systems Review of Systems  All other systems reviewed and are negative.    Physical Exam Updated Vital Signs BP 117/64 (BP Location: Left Arm)   Pulse 69   Temp 97.9 F (36.6 C) (Oral)   Resp 18   SpO2 99%   Physical Exam  Constitutional: He is oriented to person, place, and time. He appears well-developed and well-nourished.  HENT:  Head: Normocephalic and atraumatic.  Eyes: Pupils are equal, round, and reactive to light. Conjunctivae are normal. Right eye exhibits no discharge. Left eye exhibits no discharge. No scleral icterus.  Neck: Normal range of motion. No JVD present. No tracheal deviation present.  Pulmonary/Chest: Effort normal. No stridor.  Neurological: He is alert and oriented to person, place, and time. Coordination normal.  Skin:  3 cm linear laceration over the left tibia, no deep space involvement, opening clean with serous drainage no purulent drainage no surrounding redness or swelling distal sensation strength and motor function intact  Psychiatric: He has a normal mood and affect. His behavior is normal. Judgment and thought content normal.  Nursing note and vitals reviewed.    ED Treatments /  Results  Labs (all labs ordered are listed, but only abnormal results are displayed) Labs Reviewed - No data to display  EKG  EKG Interpretation None       Radiology No results found.  Procedures Procedures (including critical care time)  Medications Ordered in ED Medications  Tdap (BOOSTRIX) injection 0.5 mL (0.5 mLs Intramuscular Given 09/22/16 1237)     Initial Impression / Assessment and Plan / ED Course  I have reviewed the triage vital signs and the nursing notes.  Pertinent labs & imaging  results that were available during my care of the patient were reviewed by me and considered in my medical decision making (see chart for details).      Final Clinical Impressions(s) / ED Diagnoses   Final diagnoses:  Laceration of left lower extremity, initial encounter   41 year old male with laceration to his lower extremity.  This is beyond timeframe for closure.  This looks very well with no signs of surrounding infection.  Patient will be placed on prophylactic antibiotics, wound care instructions given, return precautions given.  He verbalized understanding and agreement to today's plan had no further questions or concerns.   New Prescriptions New Prescriptions   CEPHALEXIN (KEFLEX) 500 MG CAPSULE    Take 1 capsule (500 mg total) by mouth 4 (four) times daily.     Eyvonne Mechanic, PA-C 09/22/16 1254    Tilden Fossa, MD 09/23/16 [phone]

## 2017-07-03 IMAGING — DX DG FEMUR 1V*R*
4 series · 4 of 4 positions shown · non-contrast
Comparison: None.

CLINICAL DATA: Status post fall with injury and pain to the distal
right femur.

EXAM:
RIGHT FEMUR 1 VIEW

[femur ap (1 of 4)]
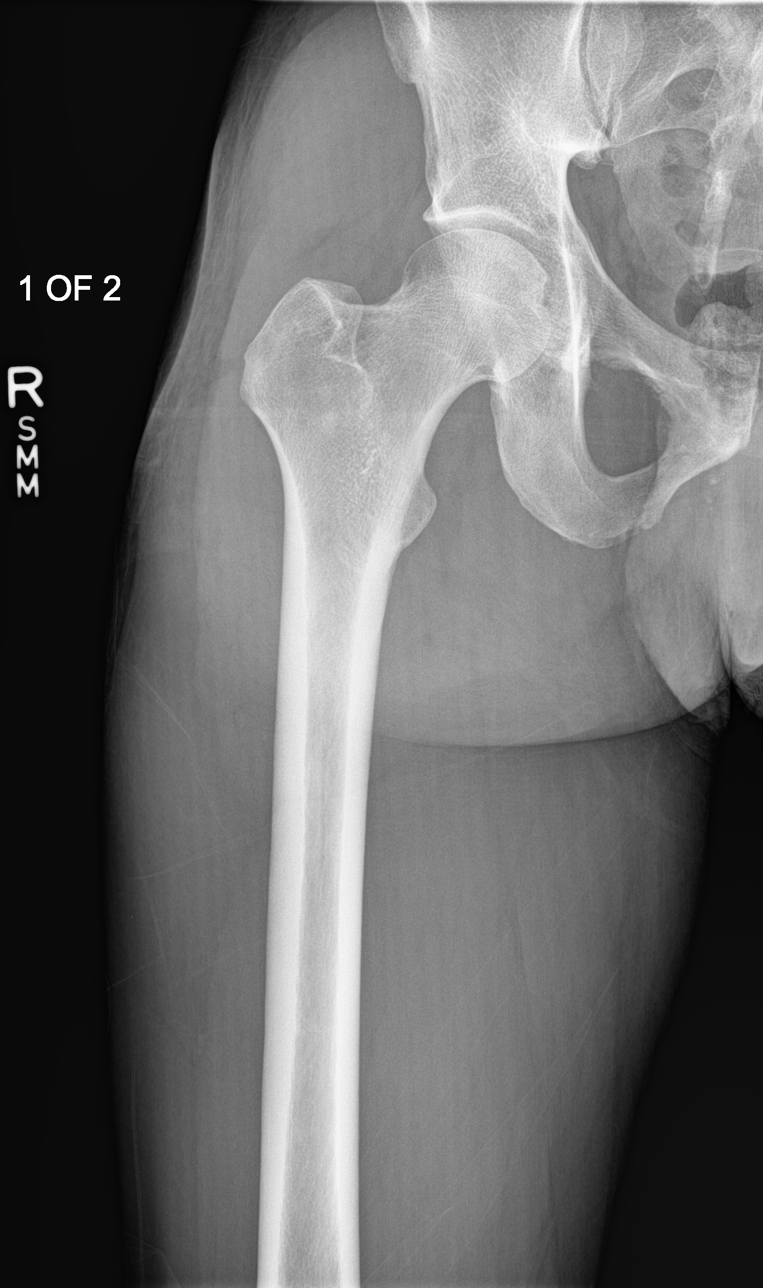

[femur ap (2 of 4)]
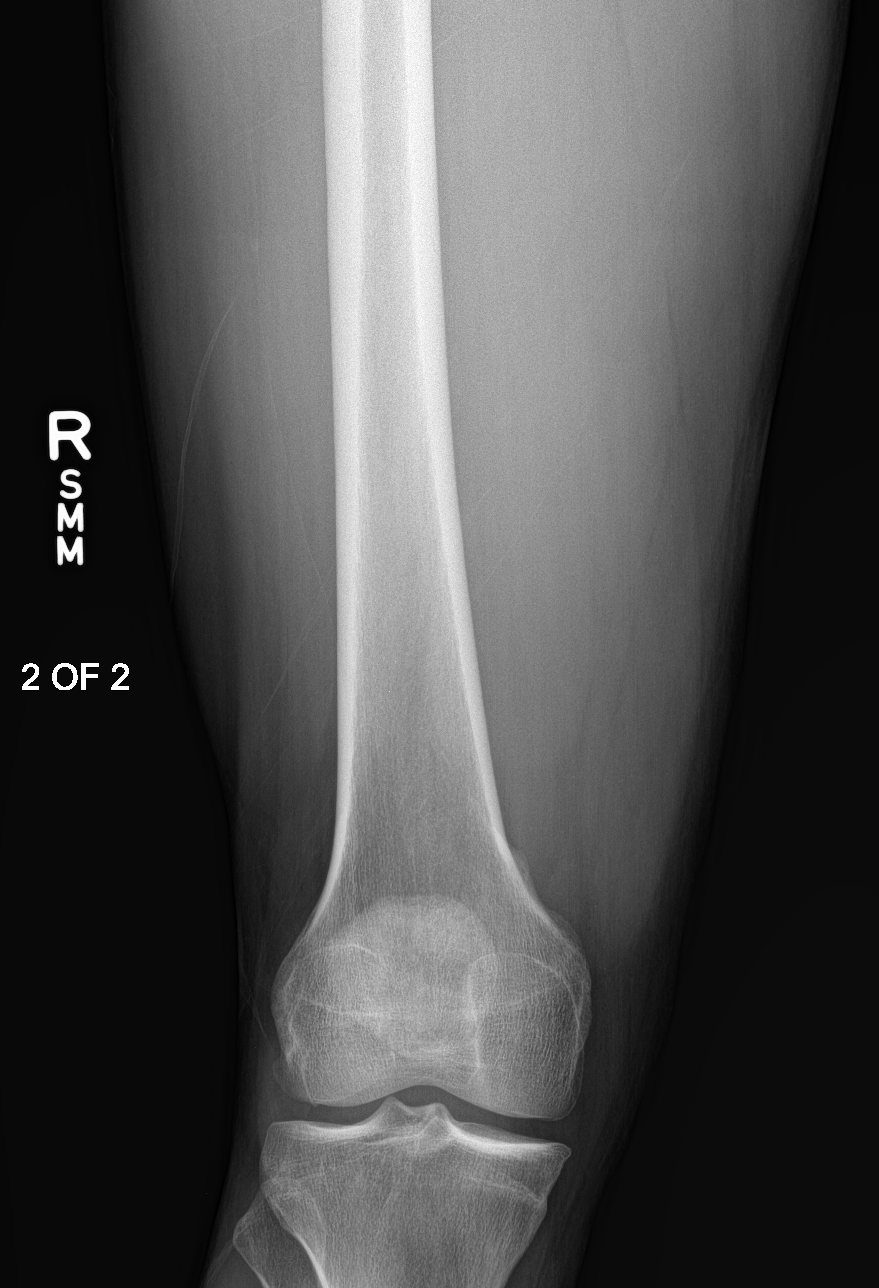

[femur ap (3 of 4)]
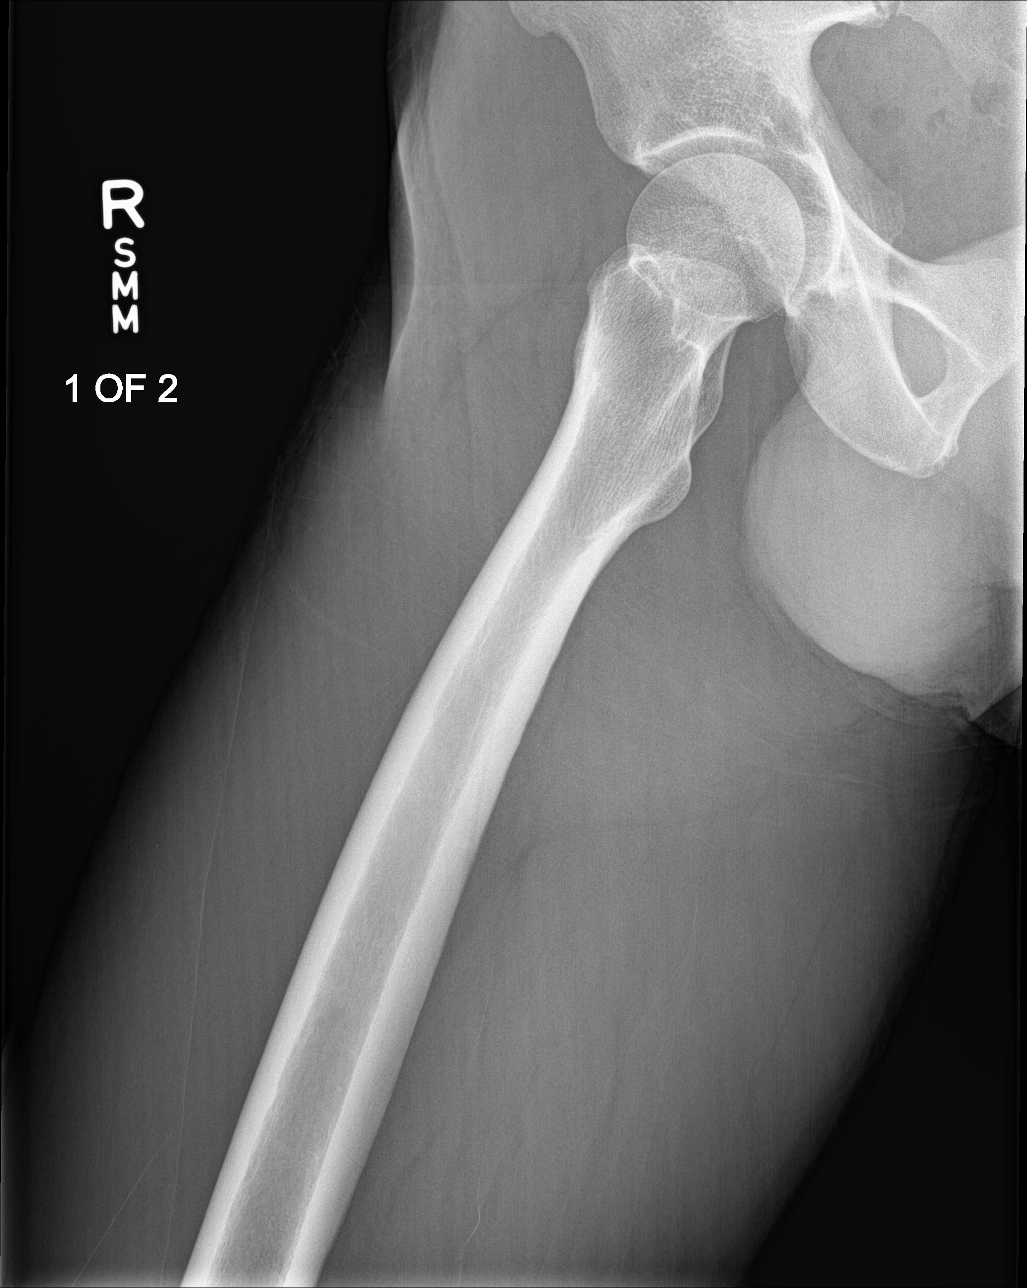

[femur ap (4 of 4)]
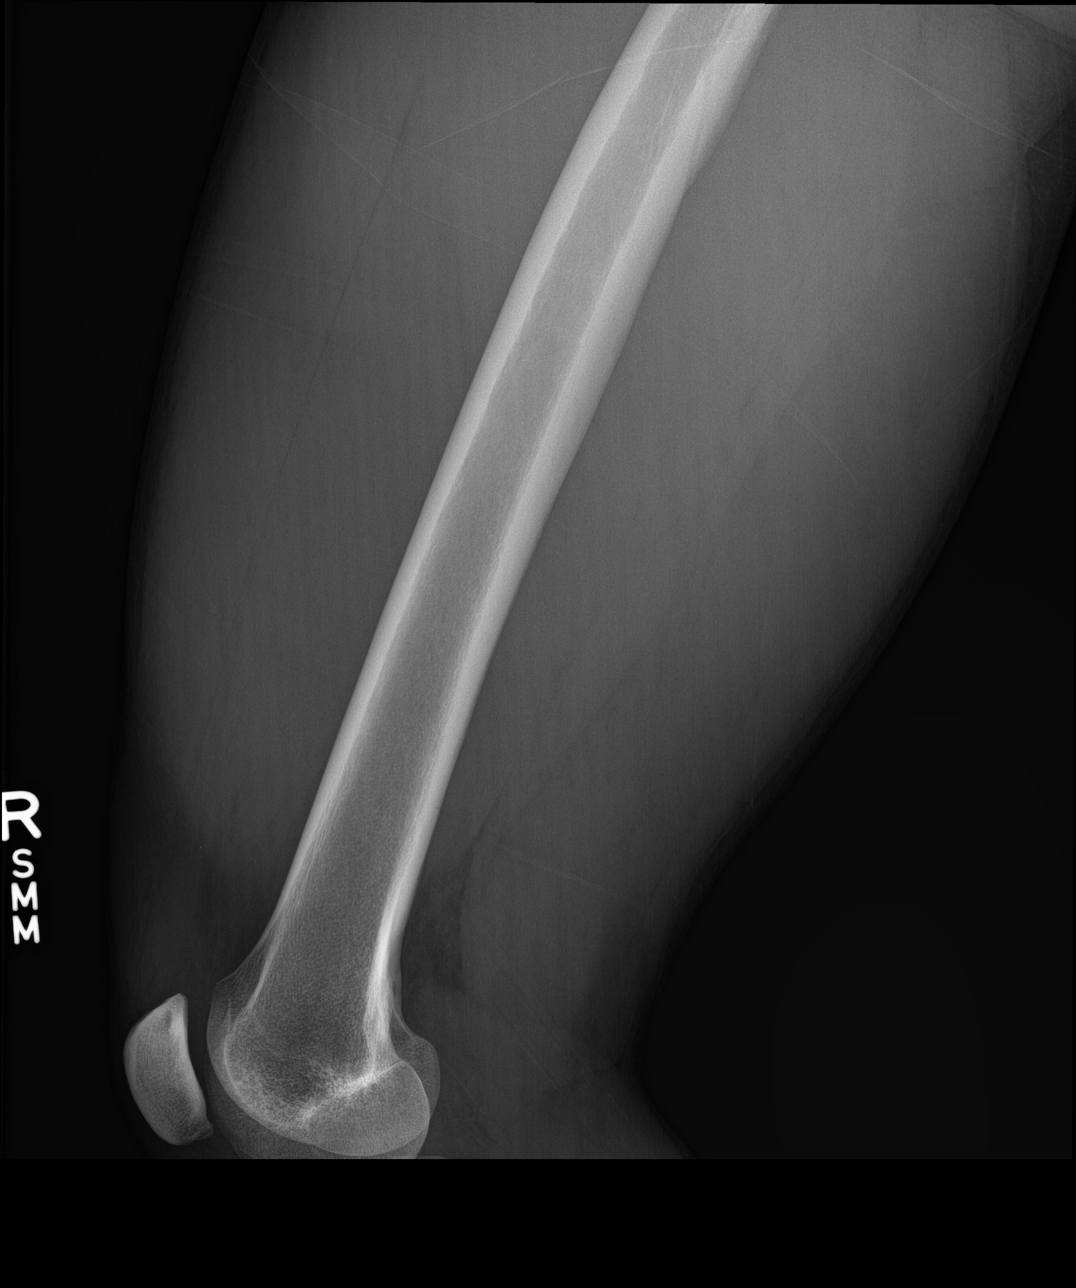

[4 of 4 positions shown; findings below may reference images not displayed]

FINDINGS: There is no evidence of fracture or other focal bone lesions. Soft
tissues are unremarkable.
IMPRESSION: No acute fracture or dislocation identified about the right femur.

## 2023-12-20 ENCOUNTER — Other Ambulatory Visit (HOSPITAL_BASED_OUTPATIENT_CLINIC_OR_DEPARTMENT_OTHER): Payer: Self-pay

## 2023-12-20 ENCOUNTER — Emergency Department (HOSPITAL_BASED_OUTPATIENT_CLINIC_OR_DEPARTMENT_OTHER)
Admission: EM | Admit: 2023-12-20 | Discharge: 2023-12-20 | Disposition: A | Discharge status: Home / Self Care | Payer: Self-pay | Attending: Emergency Medicine | Admitting: Emergency Medicine

## 2023-12-20 ENCOUNTER — Encounter (HOSPITAL_BASED_OUTPATIENT_CLINIC_OR_DEPARTMENT_OTHER): Payer: Self-pay | Admitting: Emergency Medicine

## 2023-12-20 ENCOUNTER — Other Ambulatory Visit: Payer: Self-pay

## 2023-12-20 DIAGNOSIS — K047 Periapical abscess without sinus: Secondary | ICD-10-CM | POA: Insufficient documentation

## 2023-12-20 MED ORDER — NAPROXEN 500 MG PO TABS
500.0000 mg | ORAL_TABLET | Freq: Two times a day (BID) | ORAL | 0 refills | Status: AC
  Filled 2023-12-20: qty 30, 15d supply, fill #0

## 2023-12-20 MED ORDER — AMOXICILLIN-POT CLAVULANATE 875-125 MG PO TABS
1.0000 | ORAL_TABLET | Freq: Once | ORAL | Status: AC
  Administered 2023-12-20: 1 via ORAL
  Filled 2023-12-20: qty 1

## 2023-12-20 MED ORDER — KETOROLAC TROMETHAMINE 15 MG/ML IJ SOLN
15.0000 mg | Freq: Once | INTRAMUSCULAR | Status: AC
  Administered 2023-12-20: 15 mg via INTRAMUSCULAR
  Filled 2023-12-20: qty 1

## 2023-12-20 MED ORDER — AMOXICILLIN-POT CLAVULANATE 875-125 MG PO TABS
1.0000 | ORAL_TABLET | Freq: Two times a day (BID) | ORAL | 0 refills | Status: AC
  Filled 2023-12-20: qty 14, 7d supply, fill #0

## 2023-12-20 NOTE — ED Triage Notes (Signed)
 L lower dental pain x 2 days. Taking tylenol  without relief.

## 2023-12-20 NOTE — Discharge Instructions (Signed)
 It was a pleasure taking care of you today.  As discussed, I am sending you home with an antibiotic.  Take as prescribed and finish all antibiotics. I am also sending you home with pain medication.  Take as needed for pain.  Below are dental resources.  Please call to schedule an appointment for further evaluation.  Return to the ER for any worsening symptoms.   [facility] dental clinic -  [address] [place] [address] [phone]  [facility] clinic) treats patients who are in the following groups:  Pediatrics with medicaid up to age 40 Patients over 26 with an orange card and a referral from a medical provider.  - If they are referred - there is a 40$ copay for any service - extration etc.  To get an orange card the following process is necessary.  Call the [facility] to apply for card - [phone] Once approved, you the patient will be set up with a medical provider Medical provider will refer patient to the [facility] clinic .  Offices accepting Medicaid patients:  Urgent tooth - [address] [place], [place] [address] [phone]  [facility] - [address], [place] [address] [phone]  [facility] (will see emergency dental patients from ED) - [address] [place], [place] [address] [phone]

## 2023-12-20 NOTE — ED Provider Notes (Signed)
 [facility] AT [facility] Provider Note   CSN: [id number] Arrival date & time: 12/20/23  1310     Patient presents with: Dental Pain   Drew Crosby is a 48 y.o. male with no significant past medical history who presents to the ED due to left lower dental pain x 2 days.  Notes he had his wisdom teeth removed multiple years ago and developed some swelling to area roughly 2 days ago.  No fever or chills.  No changes to phonation or trismus.  Denies any facial edema.  Called a dentist who recommended patient present to the ED to rule out infection.  Has been taking Tylenol  with no relief.  History obtained from patient and past medical records. No interpreter used during encounter.       Prior to Admission medications   Medication Sig Start Date End Date Taking? Authorizing Provider  amoxicillin -clavulanate (AUGMENTIN ) 875-125 MG tablet Take 1 tablet by mouth every 12 (twelve) hours. 12/20/23  Yes Vonna Brabson C, PA-C  naproxen  (NAPROSYN ) 500 MG tablet Take 1 tablet (500 mg total) by mouth 2 (two) times daily. 12/20/23  Yes Egor Fullilove C, PA-C  amoxicillin  (AMOXIL ) 500 MG capsule Take 1 capsule (500 mg total) by mouth 3 (three) times daily. 12/08/13   Pisciotta, Nat, PA-C  cephALEXin  (KEFLEX ) 500 MG capsule Take 1 capsule (500 mg total) by mouth 4 (four) times daily. 09/22/16   Hedges, Reyes, PA-C  clindamycin  (CLEOCIN ) 300 MG capsule Take 1 capsule (300 mg total) by mouth 3 (three) times daily. 08/27/14   Vincente Lynwood BIRCH, MD  cyclobenzaprine  (FLEXERIL ) 10 MG tablet Take 1 tablet (10 mg total) by mouth 2 (two) times daily as needed for muscle spasms. 12/29/14   Harris, Abigail, PA-C  naproxen  (NAPROSYN ) 375 MG tablet Take 1 tablet (375 mg total) by mouth 2 (two) times daily with a meal. 12/29/14   Harris, Abigail, PA-C  oxyCODONE -acetaminophen  (PERCOCET/ROXICET) 5-325 MG tablet Take 2 tablets by mouth every 4 (four) hours as needed for severe pain.  02/14/15   Dowless, Samantha Tripp, PA-C    Allergies: Patient has no known allergies.    Review of Systems  Constitutional:  Negative for fever.  HENT:  Positive for dental problem. Negative for facial swelling.     Updated Vital Signs BP 126/86 (BP Location: Right Arm)   Pulse 64   Temp 98.6 F (37 C) (Temporal)   Resp 14   Ht 5' 6 (1.676 m)   Wt 78 kg   SpO2 99%   BMI 27.76 kg/m   Physical Exam Vitals and nursing note reviewed.  Constitutional:      General: He is not in acute distress.    Appearance: He is not ill-appearing.  HENT:     Head: Normocephalic.     Mouth/Throat:     Comments: Left lower posterior molars missing. No abscess.  No facial edema.  Tongue in normal position without protrusion. Eyes:     Pupils: Pupils are equal, round, and reactive to light.  Cardiovascular:     Rate and Rhythm: Normal rate and regular rhythm.     Pulses: Normal pulses.     Heart sounds: Normal heart sounds. No murmur heard.    No friction rub. No gallop.  Pulmonary:     Effort: Pulmonary effort is normal.     Breath sounds: Normal breath sounds.  Abdominal:     General: Abdomen is flat. There is no distension.  Palpations: Abdomen is soft.     Tenderness: There is no abdominal tenderness. There is no guarding or rebound.  Musculoskeletal:        General: Normal range of motion.     Cervical back: Neck supple.  Skin:    General: Skin is warm and dry.  Neurological:     General: No focal deficit present.     Mental Status: He is alert.  Psychiatric:        Mood and Affect: Mood normal.        Behavior: Behavior normal.     (all labs ordered are listed, but only abnormal results are displayed) Labs Reviewed - No data to display  EKG: None  Radiology: No results found.   Procedures   Medications Ordered in the ED  ketorolac  (TORADOL ) 15 MG/ML injection 15 mg (has no administration in time range)  amoxicillin -clavulanate (AUGMENTIN ) 875-125 MG per  tablet 1 tablet (has no administration in time range)                                    Medical Decision Making Risk Prescription drug management.   This patient presents to the ED for concern of dental pain, this involves an extensive number of treatment options, and is a complaint that carries with it a high risk of complications and morbidity.  The differential diagnosis includes infection, abscess, ludwigs, etc  48 year old male presents to the ED due to left lower dental pain x 2 days.  Had wisdom teeth removed numerous years ago and developed pain around site roughly 2 days ago.  No fever or chills.  Upon arrival, vitals all within normal limits.  Patient well-appearing on exam.  Has no facial edema.  2 missing left lower posterior molars without evidence of abscess. Some tenderness over gums. Tongue in normal position without protrusion. No trismus. Normal phonation. Possible dental infection. Patient treated with Toradol  for pain and Augmentin  for possible infection. Dental resources given to patient at discharge. Low suspicion for Ludwigs or deep space infection. Patient stable for discharge. Strict ED precautions discussed with patient. Patient states understanding and agrees to plan. Patient discharged home in no acute distress and stable vitals  No PCP    Final diagnoses:  Dental infection    ED Discharge Orders          Ordered    amoxicillin -clavulanate (AUGMENTIN ) 875-125 MG tablet  Every 12 hours        12/20/23 1335    naproxen  (NAPROSYN ) 500 MG tablet  2 times daily        12/20/23 1336               Lorelle Aleck BROCKS, PA-C 12/20/23 1337    Bernard Drivers, MD 12/26/23 1426

## 2023-12-22 ENCOUNTER — Other Ambulatory Visit (HOSPITAL_BASED_OUTPATIENT_CLINIC_OR_DEPARTMENT_OTHER): Payer: Self-pay
